# Patient Record
Sex: Female | Born: 1960 | Race: White | Hispanic: No | Marital: Married | Smoking: Former smoker
Health system: Southern US, Community
[De-identification: ages and names within clinical notes are randomized; demographics above are authoritative.]

## PROBLEM LIST (undated history)

## (undated) DIAGNOSIS — E079 Disorder of thyroid, unspecified: Secondary | ICD-10-CM

## (undated) DIAGNOSIS — E119 Type 2 diabetes mellitus without complications: Secondary | ICD-10-CM

## (undated) DIAGNOSIS — I10 Essential (primary) hypertension: Secondary | ICD-10-CM

## (undated) HISTORY — PX: ABDOMINAL HYSTERECTOMY: SHX81

## (undated) HISTORY — PX: TOTAL ABDOMINAL HYSTERECTOMY: SHX209

---

## 2009-05-27 ENCOUNTER — Ambulatory Visit: Payer: Self-pay | Admitting: Internal Medicine

## 2011-09-22 ENCOUNTER — Ambulatory Visit: Payer: Self-pay | Admitting: Internal Medicine

## 2011-09-22 LAB — COMPREHENSIVE METABOLIC PANEL
Albumin: 3.8 g/dL (ref 3.4–5.0)
Albumin: 3.9 g/dL (ref 3.4–5.0)
Anion Gap: 8 (ref 7–16)
BUN: 10 mg/dL (ref 7–18)
Bilirubin,Total: 0.2 mg/dL (ref 0.2–1.0)
Bilirubin,Total: 0.3 mg/dL (ref 0.2–1.0)
Calcium, Total: 8.8 mg/dL (ref 8.5–10.1)
Chloride: 102 mmol/L (ref 98–107)
Chloride: 103 mmol/L (ref 98–107)
Co2: 28 mmol/L (ref 21–32)
Co2: 26 mmol/L (ref 21–32)
Creatinine: 0.94 mg/dL (ref 0.60–1.30)
Creatinine: 0.81 mg/dL (ref 0.60–1.30)
EGFR (African American): >60
EGFR (African American): >60
EGFR (Non-African Amer.): >60
EGFR (Non-African Amer.): >60
Glucose: 122 mg/dL — ABNORMAL HIGH (ref 65–99)
Glucose: 93 mg/dL (ref 65–99)
Osmolality: 276 (ref 275–301)
Osmolality: 280 (ref 275–301)
Potassium: 3.7 mmol/L (ref 3.5–5.1)
SGOT(AST): 17 U/L (ref 15–37)
SGOT(AST): 20 U/L (ref 15–37)
SGPT (ALT): 26 U/L
SGPT (ALT): 26 U/L
Sodium: 138 mmol/L (ref 136–145)
Sodium: 141 mmol/L (ref 136–145)
Total Protein: 7.3 g/dL (ref 6.4–8.2)

## 2011-09-22 LAB — CBC WITH DIFFERENTIAL/PLATELET
Eosinophil %: 1.9 %
HCT: 30.9 % — ABNORMAL LOW (ref 35.0–47.0)
HGB: 10 g/dL — ABNORMAL LOW (ref 12.0–16.0)
MCH: 26.4 pg (ref 26.0–34.0)
Monocyte #: 0.8 10*3/uL — ABNORMAL HIGH (ref 0.0–0.7)
Neutrophil #: 5 10*3/uL (ref 1.4–6.5)
Neutrophil %: 61.9 %
Platelet: 194 10*3/uL (ref 150–440)
RBC: 3.79 10*6/uL — ABNORMAL LOW (ref 3.80–5.20)
WBC: 8.1 10*3/uL (ref 3.6–11.0)

## 2011-09-22 LAB — LIPASE, BLOOD: Lipase: 123 U/L (ref 73–393)

## 2011-09-22 LAB — TROPONIN I: Troponin-I: <0.02 ng/mL

## 2011-09-22 LAB — URINALYSIS, COMPLETE
Bilirubin,UR: NEGATIVE
Blood: NEGATIVE
Leukocyte Esterase: NEGATIVE
Nitrite: NEGATIVE
Protein: NEGATIVE
RBC,UR: 2 /HPF (ref 0–5)
WBC UR: 1 /HPF (ref 0–5)

## 2011-09-22 LAB — CBC
HCT: 31.6 % — ABNORMAL LOW (ref 35.0–47.0)
RBC: 3.93 10*6/uL (ref 3.80–5.20)
WBC: 8 10*3/uL (ref 3.6–11.0)

## 2011-09-22 LAB — CK TOTAL AND CKMB (NOT AT ARMC)
CK, Total: 98 U/L (ref 21–215)
CK-MB: 0.9 ng/mL (ref 0.5–3.6)

## 2011-09-23 ENCOUNTER — Observation Stay: Payer: Self-pay | Admitting: Internal Medicine

## 2011-09-23 LAB — BASIC METABOLIC PANEL
Anion Gap: 9 (ref 7–16)
Calcium, Total: 8.9 mg/dL (ref 8.5–10.1)
Chloride: 104 mmol/L (ref 98–107)
Co2: 30 mmol/L (ref 21–32)
EGFR (Non-African Amer.): >60
Glucose: 151 mg/dL — ABNORMAL HIGH (ref 65–99)
Potassium: 4.6 mmol/L (ref 3.5–5.1)
Sodium: 143 mmol/L (ref 136–145)

## 2011-09-23 LAB — LIPID PANEL
Cholesterol: 164 mg/dL (ref 0–200)
HDL Cholesterol: 24 mg/dL — ABNORMAL LOW (ref 40–60)
Ldl Cholesterol, Calc: 83 mg/dL (ref 0–100)

## 2011-09-23 LAB — CBC WITH DIFFERENTIAL/PLATELET
Basophil #: 0 10*3/uL (ref 0.0–0.1)
Basophil %: 0.4 %
Eosinophil #: 0.3 10*3/uL (ref 0.0–0.7)
Eosinophil %: 2.7 %
HCT: 31.1 % — ABNORMAL LOW (ref 35.0–47.0)
Lymphocyte #: 2.1 10*3/uL (ref 1.0–3.6)
Lymphocyte %: 23 %
MCHC: 32.4 g/dL (ref 32.0–36.0)
MCV: 81 fL (ref 80–100)
Monocyte #: 0.8 10*3/uL — ABNORMAL HIGH (ref 0.0–0.7)
Neutrophil #: 5.9 10*3/uL (ref 1.4–6.5)
Neutrophil %: 64.7 %
Platelet: 197 10*3/uL (ref 150–440)
RDW: 14.4 % (ref 11.5–14.5)

## 2011-09-23 LAB — HEMOGLOBIN A1C: Hemoglobin A1C: 7.5 % — ABNORMAL HIGH (ref 4.2–6.3)

## 2011-09-23 LAB — TSH: Thyroid Stimulating Horm: 11 u[IU]/mL — ABNORMAL HIGH

## 2011-09-23 LAB — CK TOTAL AND CKMB (NOT AT ARMC)
CK-MB: 0.7 ng/mL (ref 0.5–3.6)
CK-MB: 0.7 ng/mL (ref 0.5–3.6)

## 2011-09-23 LAB — TROPONIN I: Troponin-I: <0.02 ng/mL

## 2011-09-25 DIAGNOSIS — I10 Essential (primary) hypertension: Secondary | ICD-10-CM | POA: Insufficient documentation

## 2011-09-25 DIAGNOSIS — E039 Hypothyroidism, unspecified: Secondary | ICD-10-CM | POA: Insufficient documentation

## 2011-09-25 DIAGNOSIS — E785 Hyperlipidemia, unspecified: Secondary | ICD-10-CM | POA: Insufficient documentation

## 2011-09-25 DIAGNOSIS — E119 Type 2 diabetes mellitus without complications: Secondary | ICD-10-CM | POA: Insufficient documentation

## 2013-09-06 ENCOUNTER — Ambulatory Visit: Payer: Self-pay | Admitting: Family Medicine

## 2013-10-02 ENCOUNTER — Emergency Department: Payer: Self-pay | Admitting: Emergency Medicine

## 2013-10-02 LAB — URINALYSIS, COMPLETE
Bilirubin,UR: NEGATIVE
GLUCOSE, UR: NEGATIVE mg/dL (ref 0–75)
KETONE: NEGATIVE
LEUKOCYTE ESTERASE: NEGATIVE
Nitrite: NEGATIVE
PH: 6 (ref 4.5–8.0)
Protein: 30
RBC,UR: <1 /HPF (ref 0–5)
SPECIFIC GRAVITY: 1.005 (ref 1.003–1.030)
Squamous Epithelial: 1

## 2013-10-02 LAB — COMPREHENSIVE METABOLIC PANEL
ALBUMIN: 4.1 g/dL (ref 3.4–5.0)
ANION GAP: 5 — AB (ref 7–16)
Alkaline Phosphatase: 62 U/L
BUN: 9 mg/dL (ref 7–18)
Bilirubin,Total: 0.5 mg/dL (ref 0.2–1.0)
CALCIUM: 9.2 mg/dL (ref 8.5–10.1)
Chloride: 105 mmol/L (ref 98–107)
Co2: 25 mmol/L (ref 21–32)
Creatinine: 0.76 mg/dL (ref 0.60–1.30)
EGFR (African American): >60
GLUCOSE: 107 mg/dL — AB (ref 65–99)
OSMOLALITY: 269 (ref 275–301)
Potassium: 3.5 mmol/L (ref 3.5–5.1)
SGOT(AST): 23 U/L (ref 15–37)
SGPT (ALT): 22 U/L (ref 12–78)
SODIUM: 135 mmol/L — AB (ref 136–145)
Total Protein: 7.8 g/dL (ref 6.4–8.2)

## 2013-10-02 LAB — CBC
HCT: 38.4 % (ref 35.0–47.0)
HGB: 12.9 g/dL (ref 12.0–16.0)
MCH: 29.8 pg (ref 26.0–34.0)
MCHC: 33.7 g/dL (ref 32.0–36.0)
MCV: 89 fL (ref 80–100)
PLATELETS: 196 10*3/uL (ref 150–440)
RBC: 4.33 10*6/uL (ref 3.80–5.20)
RDW: 13.1 % (ref 11.5–14.5)
WBC: 9.1 10*3/uL (ref 3.6–11.0)

## 2013-10-02 LAB — TROPONIN I

## 2013-10-02 LAB — TSH: Thyroid Stimulating Horm: 4.67 u[IU]/mL — ABNORMAL HIGH

## 2013-10-02 LAB — T4, FREE: Free Thyroxine: 0.83 ng/dL (ref 0.76–1.46)

## 2013-10-03 DIAGNOSIS — F419 Anxiety disorder, unspecified: Secondary | ICD-10-CM | POA: Insufficient documentation

## 2014-12-13 NOTE — Discharge Summary (Signed)
PATIENT NAME:  Kara Giles, Kara Giles MR#:  [id number] DATE OF BIRTH:  Oct 21, 1960  DATE OF ADMISSION:  09/23/2011 DATE OF DISCHARGE:  09/23/2011  DISCHARGE DIAGNOSES:  1. Chest pain, noncardiac with a stress test being negative.  2. Hypertension.  3. Diabetes.  4. Hyperlipidemia.  5. Hypothyroidism.  6. Tobacco abuse.   MEDICATIONS ON DISCHARGE:  1. Atenolol 50 mg daily.  2. Hydrochlorothiazide 50 mg daily.  3. Aspirin 81 mg daily.  4. Metformin 500 mg twice a day. 5. Celexa 20 mg daily.  6. Pravastatin 20 mg at bedtime.  7. Dexilant 60 mg daily.  8. Klor-Con 10 mEq daily.  9. Levothyroxine increased to 50 mcg daily.  10. Nicotine patch 21 mg to chest wall daily.   DISCHARGE INSTRUCTIONS: Do not smoke cigarettes.   ACTIVITY: As tolerated.   DIET: Low sodium diet 1800 ADA diet.   REASON FOR ADMISSION: The patient was admitted 09/22/2011 and discharged 09/23/2011. She came in with chest pain. She was at work at the time. She also had pain in the middle of her back worsening over a period of an hour. She took aspirin at work, went to the [facility] and was sent to the Emergency Room for further evaluation. She was admitted as an observation and a stress test was ordered.  LABORATORY AND RADIOLOGICAL DATA DURING THE HOSPITAL COURSE: Glucose 122, BUN 10, creatinine 0.94, sodium 138, potassium 3.7, chloride 102, CO2 28, calcium 8.8. Liver function tests normal. White blood cell count 8.1, hemoglobin and hematocrit 10.0 and 30.9, platelet count 194. Chest x-ray showed no acute cardiopulmonary disease. TSH 11. Hemoglobin A1c 7.5. Troponin negative. Lipase 123. Next two sets of troponins were negative. Ultrasound of the abdomen showed no cholelithiasis or sonographic evidence of acute choledocholithiasis. LDL 83, HDL 24, triglycerides 283. Stress test reported by Dr. Lady GaryFath verbally was negative; still no report in computer at the time of discharge.   HOSPITAL COURSE PER PROBLEM LIST:   1. For the patient's chest pain, this had resolved on its own. She had a stress test which was negative. Three sets of cardiac enzymes were negative. The patient was sent home in stable condition. This chest pain was not cardiac, possibly musculoskeletal.  2. For her hypertension, her blood pressure was elevated on presentation but has calmed down nicely and blood pressure 118/69 upon discharge. She will be kept on her usual medications at home.  3. For her diabetes, she is on metformin. Hemoglobin A1c is 7.5. Would like a little better blood glucose control but will leave this up to the primary care physician.  4. For the patient's hyperlipidemia, she is on pravastatin. HDL is low. Exercise should help. LDL is at goal. Triglycerides are also slightly elevated.  5. For the patient's hypothyroidism, her Synthroid was increased to 50 mcg daily for a TSH being 11.  6. For her tobacco abuse, smoking cessation counseling done three minutes by me. Nicotine patch prescribed.   TIME SPENT ON DISCHARGE: 35 minutes.   ____________________________ Herschell Dimesichard J. Renae GlossWieting, MD [id number] D: 09/24/2011 15:06:22 ET T: 09/25/2011 14:10:04 ET JOB#: [id number]  cc: Herschell Dimesichard J. Renae GlossWieting, MD, <Dictator> Dr. Royanne Footsaniel Crummett  Salley ScarletICHARD J Kimiyo Carmicheal MD ELECTRONICALLY SIGNED 10/07/2011 16:38

## 2014-12-13 NOTE — H&P (Signed)
PATIENT NAME:  Kara Giles, PHO MR#:  [id number] DATE OF BIRTH:  11-Jun-1961  DATE OF ADMISSION:  09/23/2011  PRIMARY CARE PHYSICIAN: Dr. Maudie Mercury from [facility].  The patient is a 54 year old Caucasian female with past medical history significant for history of hypertension, diabetes, hyperlipidemia, also early coronary artery disease in the family, as well as smoking, presented to the hospital with complaints of chest pains. According to the patient she was doing well up until on the day of admission. 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 Pain was described as severe pain, worsening over a period of one hour between her shoulder blades as well as in the middle of her chest, 8 to 10 by intensity, did not worsen with any exertion, however, somewhat improved after she sat down. It lasted approximately four hours. No radiation was noted. She took 650 mg of aspirin at work and was sent to [facility]. From [facility] she was sent to the Emergency Room for further evaluation.   In [facility], her blood pressure was noted to be 170/70; her heart rate as well as her respiratory rate, and oxygen saturations were within normal limits.  On arrival to the Emergency Room; however, her blood pressure is within normal limits, and she does not have any significant pains in her chest, however, admits of having some soreness in the anterior chest. She has never had such kind of pains in the past.  PAST MEDICAL HISTORY: Significant for hypertension, diabetes mellitus for the past two years, history of hyperlipidemia. Also family history of coronary artery disease, patient's mother had a heart attack at early age and died massive myocardial infarction at the age of 1. She has also personal smoking history and hypothyroidism.  MEDICATIONS: Pravastatin 10 mg p.o. daily. Aspirin 81  mg p.o. daily. Atenolol 50 mg p.o. daily. Citalopram 10 mg p.o. daily. Dexilant 60 mg p.o. daily. Hydrochlorothiazide 50 mg p.o. daily. K-Dur 10 mg p.o. daily. Synthroid 50 mcg p.o. daily. Metformin 500 mg p.o. twice daily.   PAST SURGICAL HISTORY: Left ovary surgery as well as tube was removed at age of 70, right foot surgery as outpatient.   ALLERGIES: None.  FAMILY HISTORY: Diabetes mellitus in the patient's father who died of kidney failure. The patient's mother also was diabetic, she had coronary artery disease and died at the age of 18 of myocardial infarction. The patient's sister is also diabetic. Hypertension in multiple family members.   SOCIAL HISTORY: The patient is married, has one child who is 72 years old, smokes 1 pack per day for more than 30 years. Denies any alcohol abuse. She works in Adult nurse.   REVIEW OF SYSTEMS: Positive for fatigue and weakness for the past 3 or 4 weeks. The patient's husband tells me that she comes back from work and she is very tired, and she goes to bed earlier than usual. She is having chest pains today. Also using glasses, which are transitional glasses. Coughing and wheezing intermittently, especially at nighttime, and some nasal drip as well as soreness in nose which she attributes to oil from the plant from work. Has shortness of breath intermittently, especially on exertion. She had an episode of arrhythmia last year which was felt to be due to hyperthyroidism, or high doses of steroid medications and chest pains as well as back pains radiating  to the back earlier today. Also intermittent diarrhea and constipation over the past couple of weeks. Denies any fevers or chills, weight loss or gain. EYES: In regards to eyes, denies any blurry vision, double vision, glaucoma, or cataracts. ENT: Denies any tinnitus, allergies, epistaxis, sinus pain, dentures, and difficulty swallowing. RESPIRATORY: Denies any hemoptysis, asthma, or  chronic obstructive pulmonary disease. CARDIOVASCULAR: Denies any orthopnea, edema, palpitations, or syncope. GASTROINTESTINAL: Denies any nausea, hematemesis or rectal bleeding, change in bowel habits. GENITOURINARY: Denies dysuria, hematuria, frequency, or incontinence. ENDOCRINOLOGY: Denies polydipsia, nocturia, thyroid problems, heat or cold intolerance, or thirst. HEMATOLOGIC: Denies anemia, easy bruising, bleeding, or swollen glands. SKIN: Denies any acne, rash, lesions, or change in moles. MUSCULOSKELETAL: Denies arthritis, cramps, swelling, or gout. NEUROLOGIC: No numbness, epilepsy, or tremors. PSYCHIATRIC: No anxiety, insomnia, or depression.   PHYSICAL EXAMINATION: VITAL SIGNS: On arrival to the hospital temperature is 98.1, pulse 74, respiratory rate 18, blood pressure 138/63, saturation 94% on room air.   GENERAL: This is a well developed, well nourished obese Caucasian female in no significant distress sitting on the stretcher and eating a milkshake.  HEENT: Pupils are equal and reactive to light. Extraocular muscles are intact. No icterus or conjunctivitis. She has normal hearing. No oropharyngeal erythema. Mucosa is moist.   NECK: Nontender, no masses, supple. Thyroid is not enlarged. No adenopathy. No JVD or carotid bruits bilaterally. Full range of motion.   LUNGS: Mildly diminished breath sounds but there is no significant wheezing. No labored inspirations, increased effort, dullness to percussion, not in overt respiratory distress. No rales or rhonchi. Clear to auscultation.  CARDIOVASCULAR: S1 and S2 appreciated. No murmurs, gallops, or rubs noted. Chest is nontender to palpation. . No lower extremity edema, calf tenderness, or cyanosis noted.   ABDOMEN: Soft and nontender. Bowel sounds are present. No hepatomegaly or masses were noted.   RECTAL: Deferred.   MUSCULOSKELETAL: Able to move extremities. No cyanosis, degenerative joint disease, or  kyphosis. Gait is not tested.    SKIN: Did not reveal any rashes, lesions, erythema, nodularity, or induration. It was warm and dry to palpation.   LYMPH: No adenopathy in the cervical region to percussion. Palpation over patient's thoracic spine was unremarkable.  NEUROLOGICAL: Cranial nerves grossly intact. Sensory is intact. No dysarthria or aphasia. The patient is alert and oriented to time, person, and place. Cooperative. Memory is good.   PSYCHIATRIC: No significant confusion, agitation, or depression noted.  LABORATORY STUDIES: BMP within normal limits. Liver enzymes were normal. Cardiac enzymes were negative. CBC within normal limits except hemoglobin level is low at 10.4. Urinalysis: Yellow, hazy urine, negative glucose, bilirubin or ketones, specific gravity 1.018, pH of 6.0, negative for blood protein, nitrites or leukocyte esterase, two red blood cells, one white blood cell, trace bacteria, three epithelial cells and mucous is present.  RADIOLOGIC STUDIES: Chest PA and lateral showed no acute disease of the chest. Ultrasound of abdomen, limited survey September 22, 2011, revealed no cholelithiasis. There is no active evidence of acute cholecystitis. EKG: Normal sinus rhythm at 69 beats per minute, normal axis, no acute ST-T changes.   ASSESSMENT AND PLAN: 1. Chest pain. Admit the patient to medical floor. Continue her on metoprolol, aspirin as well as nitroglycerin, and heparin subcutaneous. Will get Myoview stress test in the morning.  2. Hypertension. Will change atenolol to metoprolol for now. We will continue her on metoprolol as well as nitroglycerin and hydrochlorothiazide. The patient may need to have her medications advanced because of her significantly  elevated blood pressure on arrival, we'll follow.  3. Hyperlipidemia. Continue Pravachol. Get lipid panel in the morning. 4. Diabetes mellitus. Continue metformin as well as sliding scale insulin. Check hemoglobin A1c. 5. Tobacco abuse. Discussed with the  patient for five minutes. She is agreeable to initiate nicotine replacement therapy and is willing to try to quit.   TIME SPENT: 50 minutes     ____________________________ Katharina Caperima Shanece Cochrane, MD rv:kma D: 09/22/2011 22:37:20 ET T: 09/23/2011 09:47:53 ET JOB#: [id number]  cc: Katharina Caperima Yvonna Brun, MD, <Dictator> Dr. Maudie Mercuryrummett from [facility] Jeris Roser MD ELECTRONICALLY SIGNED 10/16/2011 17:06

## 2019-07-02 ENCOUNTER — Other Ambulatory Visit: Payer: Self-pay

## 2019-07-02 DIAGNOSIS — Z20822 Contact with and (suspected) exposure to covid-19: Secondary | ICD-10-CM

## 2019-07-04 LAB — NOVEL CORONAVIRUS, NAA: SARS-CoV-2, NAA: NOT DETECTED

## 2019-10-21 DIAGNOSIS — G4733 Obstructive sleep apnea (adult) (pediatric): Secondary | ICD-10-CM | POA: Insufficient documentation

## 2019-10-21 DIAGNOSIS — G2581 Restless legs syndrome: Secondary | ICD-10-CM | POA: Insufficient documentation

## 2019-10-22 DIAGNOSIS — D649 Anemia, unspecified: Secondary | ICD-10-CM | POA: Insufficient documentation

## 2020-11-29 LAB — HM DEXA SCAN

## 2020-11-29 LAB — HM MAMMOGRAPHY

## 2021-05-20 ENCOUNTER — Other Ambulatory Visit: Payer: Self-pay

## 2021-05-20 ENCOUNTER — Ambulatory Visit
Admission: EM | Admit: 2021-05-20 | Discharge: 2021-05-20 | Disposition: A | Discharge status: Home / Self Care | Payer: 59

## 2021-05-20 ENCOUNTER — Ambulatory Visit (INDEPENDENT_AMBULATORY_CARE_PROVIDER_SITE_OTHER): Payer: 59

## 2021-05-20 ENCOUNTER — Encounter: Payer: Self-pay | Admitting: Emergency Medicine

## 2021-05-20 DIAGNOSIS — W19XXXA Unspecified fall, initial encounter: Secondary | ICD-10-CM | POA: Diagnosis not present

## 2021-05-20 DIAGNOSIS — S20212A Contusion of left front wall of thorax, initial encounter: Secondary | ICD-10-CM

## 2021-05-20 DIAGNOSIS — R0782 Intercostal pain: Secondary | ICD-10-CM

## 2021-05-20 HISTORY — DX: Type 2 diabetes mellitus without complications: E11.9

## 2021-05-20 HISTORY — DX: Essential (primary) hypertension: I10

## 2021-05-20 HISTORY — DX: Disorder of thyroid, unspecified: E07.9

## 2021-05-20 MED ORDER — TRAMADOL HCL 50 MG PO TABS: 50.0000 mg | ORAL_TABLET | Freq: Four times a day (QID) | ORAL | 0 refills | Status: AC | PRN

## 2021-05-20 NOTE — Discharge Instructions (Signed)
-  X-rays not show any fractures. -As we discussed you can apply ice to this area and take ibuprofen and Tylenol as needed for pain.  Also consider use of lidocaine patches and muscle rubs. -At the least you have bruised your ribs. -You should be seen again if the pain worsens you develop increased pain on breathing, shortness of breath, cough, fever, etc.

## 2021-05-20 NOTE — ED Provider Notes (Signed)
[facility]    CSN: [id number] Arrival date & time: 05/20/21  1602      History   Chief Complaint Chief Complaint  Patient presents with   Fall   Back Pain    HPI Kara Giles is a 60 y.o. female presenting for left lower ribs and left lower back pain following an accidental slip and fall at home about an hour prior to arrival to [facility] urgent care.  Patient says she was wearing flip-flops and was going out to get the mail when she slipped on water.  Patient has broken ribs on the left side before and says it feels similar.  She admits to sharp pains intermittently.  Has increased pain when twisting and occasionally when taking deep breaths but denies any shortness of breath.  Patient has taken 800 mg of ibuprofen prior to arrival to [facility] urgent care.  She denies any other injuries.  She denies head injury or loss of consciousness.  No other complaints.  HPI  Past Medical History:  Diagnosis Date   Diabetes mellitus without complication (HCC)    Hypertension    Thyroid disease     There are no problems to display for this patient.   Past Surgical History:  Procedure Laterality Date   ABDOMINAL HYSTERECTOMY      OB History   No obstetric history on file.      Home Medications    Prior to Admission medications   Medication Sig Start Date End Date Taking? Authorizing Provider  amLODipine (NORVASC) 2.5 MG tablet  03/04/21  Yes [provider]  atenolol (TENORMIN) 50 MG tablet Take 50 mg by mouth 2 (two) times daily. 04/01/21  Yes [provider]  doxepin (SINEQUAN) 10 MG capsule SMARTSIG:1 Capsule(s) By Mouth Every Evening 04/30/21  Yes [provider]  DULoxetine (CYMBALTA) 20 MG capsule Take by mouth. 05/02/21  Yes [provider]  esomeprazole (NEXIUM) 40 MG capsule Take 1 tablet by mouth daily. 01/13/20  Yes [provider]  hydrochlorothiazide (HYDRODIURIL) 12.5 MG tablet Take 1 tablet by mouth daily. 10/19/20   Yes [provider]  latanoprost (XALATAN) 0.005 % ophthalmic solution 1 drop at bedtime. 04/18/21  Yes [provider]  levothyroxine (SYNTHROID) 75 MCG tablet Take 1 tablet by mouth daily. 05/02/21  Yes [provider]  losartan (COZAAR) 100 MG tablet  03/04/21  Yes [provider]  metFORMIN (GLUCOPHAGE) 1000 MG tablet Take 1,000 mg by mouth 2 (two) times daily. 04/28/21  Yes [provider]  rosuvastatin (CRESTOR) 40 MG tablet Take 40 mg by mouth daily. 04/28/21  Yes [provider]  Semaglutide, 2 MG/DOSE, (OZEMPIC, 2 MG/DOSE,) 8 MG/3ML SOPN Inject into the skin. 02/18/21  Yes [provider]  traMADol (ULTRAM) 50 MG tablet Take 1 tablet (50 mg total) by mouth every 6 (six) hours as needed for up to 3 days. 05/20/21 05/23/21 Yes Shirlee Latch, PA-C    Family History History reviewed. No pertinent family history.  Social History Social History   Tobacco Use   Smoking status: Some Days    Types: Cigarettes   Smokeless tobacco: Never  Vaping Use   Vaping Use: Never used  Substance Use Topics   Alcohol use: Not Currently   Drug use: Never     Allergies   Lisinopril   Review of Systems Review of Systems  Constitutional:  Negative for fatigue.  Respiratory:  Negative for cough and shortness of breath.   Cardiovascular:  Negative for chest pain.  Gastrointestinal:  Negative for abdominal pain.  Musculoskeletal:  Positive for arthralgias (left rib pain) and back pain. Negative for gait problem.  Skin:  Negative for color change and wound.  Neurological:  Negative for dizziness, syncope, weakness, numbness and headaches.    Physical Exam Triage Vital Signs ED Triage Vitals  Enc Vitals Group     BP 05/20/21 1631 122/71     Pulse Rate 05/20/21 1631 98     Resp 05/20/21 1631 14     Temp 05/20/21 1631 98.4 F (36.9 C)     Temp Source 05/20/21 1631 Oral     SpO2 05/20/21 1631 97 %     Weight 05/20/21 1625 193 lb (87.5  kg)     Height 05/20/21 1625 5\' 7"  (1.702 m)     Head Circumference --      Peak Flow --      Pain Score 05/20/21 1625 9     Pain Loc --      Pain Edu? --      Excl. in GC? --    No data found.  Updated Vital Signs BP 122/71 (BP Location: Left Arm)   Pulse 98   Temp 98.4 F (36.9 C) (Oral)   Resp 14   Ht 5\' 7"  (1.702 m)   Wt 193 lb (87.5 kg)   SpO2 97%   BMI 30.23 kg/m      Physical Exam Vitals and nursing note reviewed.  Constitutional:      General: She is not in acute distress.    Appearance: Normal appearance. She is not ill-appearing or toxic-appearing.  HENT:     Head: Normocephalic and atraumatic.  Eyes:     General: No scleral icterus.       Right eye: No discharge.        Left eye: No discharge.     Conjunctiva/sclera: Conjunctivae normal.  Cardiovascular:     Rate and Rhythm: Normal rate and regular rhythm.     Heart sounds: Normal heart sounds.  Pulmonary:     Effort: Pulmonary effort is normal. No respiratory distress.     Breath sounds: Normal breath sounds.  Musculoskeletal:     Cervical back: Neck supple.     Comments: There is tenderness palpation along the lower left posterior/lateral ribs.  No step-offs noted.  No spinal tenderness.  Mild tenderness to palpation of the upper lumbar paravertebral muscles.  Skin:    General: Skin is dry.  Neurological:     General: No focal deficit present.     Mental Status: She is alert. Mental status is at baseline.     Motor: No weakness.     Gait: Gait normal.  Psychiatric:        Mood and Affect: Mood normal.        Behavior: Behavior normal.        Thought Content: Thought content normal.     UC Treatments / Results  Labs (all labs ordered are listed, but only abnormal results are displayed) Labs Reviewed - No data to display  EKG   Radiology DG Ribs Unilateral W/Chest Left  Result Date: 05/20/2021 CLINICAL DATA:  Fall today onto left side.  Left rib and chest pain. EXAM: LEFT RIBS AND CHEST  - 3+ VIEW COMPARISON:  Chest radiograph on 10/02/2013 FINDINGS: No acute fracture or other bone lesions are seen involving the ribs. Incidental note is made of an old fracture deformity of the right posterior  9th rib. There is no evidence of pneumothorax or pleural effusion. Both lungs are clear. Azygous fissure incidentally noted. Heart size and mediastinal contours are within normal limits. IMPRESSION: No acute findings. Electronically Signed   By: Danae Orleans M.D.   On: 05/20/2021 17:34    Procedures Procedures (including critical care time)  Medications Ordered in UC Medications - No data to display  Initial Impression / Assessment and Plan / UC Course  I have reviewed the triage vital signs and the nursing notes.  Pertinent labs & imaging results that were available during my care of the patient were reviewed by me and considered in my medical decision making (see chart for details).  60 year old female presenting for left-sided lower rib pain and low back pain.  Patient had accidental fall about an hour prior to arrival to urgent care.  X-rays obtained of left ribs and chest.  X-rays do not show any acute fractures.  Does show that she has an old fracture deformity of the right ninth rib.  Reviewed results of x-ray with patient.  Advised her that her symptoms are consistent with a rib contusion.  I advised supportive care as well as following RICE guidelines.  I have sent in a short supply of tramadol after reviewing the controlled substance database and finding patient to be low risk for abuse.  Also advised other medications including ibuprofen and Tylenol as well as lidocaine patches.  Return and ED precautions reviewed.  Final Clinical Impressions(s) / UC Diagnoses   Final diagnoses:  Contusion of rib on left side, initial encounter  Fall, initial encounter     Discharge Instructions      -X-rays not show any fractures. -As we discussed you can apply ice to this area and take  ibuprofen and Tylenol as needed for pain.  Also consider use of lidocaine patches and muscle rubs. -At the least you have bruised your ribs. -You should be seen again if the pain worsens you develop increased pain on breathing, shortness of breath, cough, fever, etc.     ED Prescriptions     Medication Sig Dispense Auth. Provider   traMADol (ULTRAM) 50 MG tablet Take 1 tablet (50 mg total) by mouth every 6 (six) hours as needed for up to 3 days. 10 tablet Shirlee Latch, PA-C      I have reviewed the PDMP during this encounter.   Shirlee Latch, PA-C 05/20/21 [phone]

## 2021-05-20 NOTE — ED Triage Notes (Signed)
Patient states that she fell about 1 hour ago after slipping on her outdoor steps at her house.  Patient c/o left sided lower back pain.

## 2022-01-21 ENCOUNTER — Other Ambulatory Visit: Payer: Self-pay

## 2022-01-21 ENCOUNTER — Emergency Department: Payer: 59

## 2022-01-21 ENCOUNTER — Encounter: Payer: Self-pay | Admitting: Emergency Medicine

## 2022-01-21 ENCOUNTER — Emergency Department
Admission: EM | Admit: 2022-01-21 | Discharge: 2022-01-21 | Disposition: A | Discharge status: Home / Self Care | Payer: 59 | Attending: Student in an Organized Health Care Education/Training Program | Admitting: Student in an Organized Health Care Education/Training Program

## 2022-01-21 DIAGNOSIS — R112 Nausea with vomiting, unspecified: Secondary | ICD-10-CM | POA: Diagnosis present

## 2022-01-21 DIAGNOSIS — R197 Diarrhea, unspecified: Secondary | ICD-10-CM

## 2022-01-21 DIAGNOSIS — E860 Dehydration: Secondary | ICD-10-CM | POA: Insufficient documentation

## 2022-01-21 DIAGNOSIS — I10 Essential (primary) hypertension: Secondary | ICD-10-CM | POA: Diagnosis not present

## 2022-01-21 DIAGNOSIS — R000 Tachycardia, unspecified: Secondary | ICD-10-CM | POA: Insufficient documentation

## 2022-01-21 DIAGNOSIS — E119 Type 2 diabetes mellitus without complications: Secondary | ICD-10-CM | POA: Diagnosis not present

## 2022-01-21 DIAGNOSIS — R109 Unspecified abdominal pain: Secondary | ICD-10-CM | POA: Diagnosis not present

## 2022-01-21 LAB — URINALYSIS, ROUTINE W REFLEX MICROSCOPIC
Bilirubin Urine: NEGATIVE
Glucose, UA: NEGATIVE mg/dL
Hgb urine dipstick: NEGATIVE
Ketones, ur: 5 mg/dL — AB
Leukocytes,Ua: NEGATIVE
Nitrite: NEGATIVE
Protein, ur: 30 mg/dL — AB
Specific Gravity, Urine: 1.026 (ref 1.005–1.030)
pH: 5 (ref 5.0–8.0)

## 2022-01-21 LAB — CBC WITH DIFFERENTIAL/PLATELET
Abs Immature Granulocytes: 0.03 10*3/uL (ref 0.00–0.07)
Basophils Absolute: 0 10*3/uL (ref 0.0–0.1)
Basophils Relative: 0 %
Eosinophils Absolute: 0 10*3/uL (ref 0.0–0.5)
Eosinophils Relative: 1 %
HCT: 39.1 % (ref 36.0–46.0)
Hemoglobin: 13.3 g/dL (ref 12.0–15.0)
Immature Granulocytes: 0 %
Lymphocytes Relative: 8 %
Lymphs Abs: 0.7 10*3/uL (ref 0.7–4.0)
MCH: 30.2 pg (ref 26.0–34.0)
MCHC: 34 g/dL (ref 30.0–36.0)
MCV: 88.9 fL (ref 80.0–100.0)
Monocytes Absolute: 0.9 10*3/uL (ref 0.1–1.0)
Monocytes Relative: 11 %
Neutro Abs: 6.7 10*3/uL (ref 1.7–7.7)
Neutrophils Relative %: 80 %
Platelets: 196 10*3/uL (ref 150–400)
RBC: 4.4 MIL/uL (ref 3.87–5.11)
RDW: 12.4 % (ref 11.5–15.5)
WBC: 8.4 10*3/uL (ref 4.0–10.5)
nRBC: 0 % (ref 0.0–0.2)

## 2022-01-21 LAB — BASIC METABOLIC PANEL
Anion gap: 12 (ref 5–15)
BUN: 23 mg/dL — ABNORMAL HIGH (ref 6–20)
CO2: 25 mmol/L (ref 22–32)
Calcium: 9 mg/dL (ref 8.9–10.3)
Chloride: 98 mmol/L (ref 98–111)
Creatinine, Ser: 0.85 mg/dL (ref 0.44–1.00)
GFR, Estimated: >60 mL/min (ref >60)
Glucose, Bld: 184 mg/dL — ABNORMAL HIGH (ref 70–99)
Potassium: 3.2 mmol/L — ABNORMAL LOW (ref 3.5–5.1)
Sodium: 135 mmol/L (ref 135–145)

## 2022-01-21 LAB — HEPATIC FUNCTION PANEL
ALT: 26 U/L (ref 0–44)
AST: 26 U/L (ref 15–41)
Albumin: 4.5 g/dL (ref 3.5–5.0)
Alkaline Phosphatase: 50 U/L (ref 38–126)
Bilirubin, Direct: 0.2 mg/dL (ref 0.0–0.2)
Indirect Bilirubin: 0.8 mg/dL (ref 0.3–0.9)
Total Bilirubin: 1 mg/dL (ref 0.3–1.2)
Total Protein: 8.1 g/dL (ref 6.5–8.1)

## 2022-01-21 LAB — LIPASE, BLOOD: Lipase: 26 U/L (ref 11–51)

## 2022-01-21 MED ORDER — ONDANSETRON 4 MG PO TBDP
8.0000 mg | ORAL_TABLET | Freq: Once | ORAL | Status: AC
  Administered 2022-01-21: 8 mg via ORAL

## 2022-01-21 MED ORDER — ONDANSETRON 4 MG PO TBDP: 4.0000 mg | ORAL_TABLET | Freq: Three times a day (TID) | ORAL | 0 refills | Status: AC | PRN

## 2022-01-21 MED ORDER — ONDANSETRON 4 MG PO TBDP
ORAL_TABLET | ORAL | Status: AC
  Filled 2022-01-21: qty 2

## 2022-01-21 MED ORDER — SODIUM CHLORIDE 0.9 % IV BOLUS
1000.0000 mL | Freq: Once | INTRAVENOUS | Status: AC
  Administered 2022-01-21: 1000 mL via INTRAVENOUS

## 2022-01-21 MED ORDER — IBUPROFEN 600 MG PO TABS
600.0000 mg | ORAL_TABLET | Freq: Once | ORAL | Status: AC
  Administered 2022-01-21: 600 mg via ORAL
  Filled 2022-01-21: qty 1

## 2022-01-21 NOTE — ED Notes (Signed)
Pt able to keep down graham crackers w/ peanut butter and ginger ale- IV bolus has approx 235ml left

## 2022-01-21 NOTE — ED Notes (Signed)
Dr Robinson at bedside 

## 2022-01-21 NOTE — ED Provider Notes (Signed)
[facility] Provider Note    Event Date/Time   First MD Initiated Contact with Patient 01/21/22 [phone]     (approximate)   History   Emesis and Diarrhea   HPI  Kara Giles is a 61 y.o. female with a history of diabetes and hypertension presents to the ER for several days of nausea vomiting diarrhea.  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  She does have crampy abdominal pain.  No cough or congestion no chest pain.     Physical Exam   Triage Vital Signs: ED Triage Vitals  Enc Vitals Group     BP 01/21/22 0451 128/71     Pulse Rate 01/21/22 0451 (!) 130     Resp 01/21/22 0451 20     Temp 01/21/22 0451 100.1 F (37.8 C)     Temp Source 01/21/22 0451 Oral     SpO2 01/21/22 0451 93 %     Weight 01/21/22 0452 198 lb (89.8 kg)     Height 01/21/22 0452 5\' 7"  (1.702 m)     Head Circumference --      Peak Flow --      Pain Score 01/21/22 0451 7     Pain Loc --      Pain Edu? --      Excl. in GC? --     Most recent vital signs: Vitals:   01/21/22 0947 01/21/22 1000  BP:  (!) 114/59  Pulse: 100 (!) 103  Resp:  18  Temp:    SpO2: 97% 96%     Constitutional: Alert  Eyes: Conjunctivae are normal.  Head: Atraumatic. Nose: No congestion/rhinnorhea. Mouth/Throat: Mucous membranes are moist.   Neck: Painless ROM.  Cardiovascular:   Good peripheral circulation. Respiratory: Normal respiratory effort.  No retractions.  Gastrointestinal: Soft and nontender in all four quadrants Musculoskeletal:  no deformity Neurologic:  MAE spontaneously. No gross focal neurologic deficits are appreciated.  Skin:  Skin is warm, dry and intact. No rash noted. Psychiatric: Mood and affect are normal. Speech and behavior are normal.    ED Results / Procedures / Treatments   Labs (all labs ordered are listed, but only abnormal results are displayed) Labs Reviewed  URINALYSIS, ROUTINE  W REFLEX MICROSCOPIC - Abnormal; Notable for the following components:      Result Value   Color, Urine YELLOW (*)    APPearance CLOUDY (*)    Ketones, ur 5 (*)    Protein, ur 30 (*)    Bacteria, UA RARE (*)    All other components within normal limits  BASIC METABOLIC PANEL - Abnormal; Notable for the following components:   Potassium 3.2 (*)    Glucose, Bld 184 (*)    BUN 23 (*)    All other components within normal limits  CBC WITH DIFFERENTIAL/PLATELET  HEPATIC FUNCTION PANEL  LIPASE, BLOOD     EKG     RADIOLOGY Please see ED Course for my review and interpretation.  I personally reviewed all radiographic images ordered to evaluate for the above acute complaints and reviewed radiology reports and findings.  These findings were personally discussed with the patient.  Please see medical record for radiology report.    PROCEDURES:  Critical Care performed: No  Procedures   MEDICATIONS ORDERED IN ED: Medications  ondansetron (ZOFRAN-ODT) disintegrating tablet 8 mg (8 mg Oral Given 01/21/22 0458)  sodium chloride 0.9 %  bolus 1,000 mL (0 mLs Intravenous Stopped 01/21/22 0850)  ibuprofen (ADVIL) tablet 600 mg (600 mg Oral Given 01/21/22 0731)  sodium chloride 0.9 % bolus 1,000 mL (0 mLs Intravenous Stopped 01/21/22 1005)     IMPRESSION / MDM / ASSESSMENT AND PLAN / ED COURSE  I reviewed the triage vital signs and the nursing notes.                              Differential diagnosis includes, but is not limited to, enteritis, gastritis, dehydration, electrolyte abnormality, SBO, biliary pathology, pancreatitis, diverticulitis, colitis  Patient presented to the ER for evaluation of symptoms as described above she is low-grade temperature mildly tachycardic but well perfused.  This presenting complaint could reflect a potentially life-threatening illness therefore the patient will be placed on continuous pulse oximetry and telemetry for monitoring.  Laboratory evaluation will  be sent to evaluate for the above complaints.  We will give IV fluids given concern for dehydration.  Will order x-ray to evaluate for obstructive pattern.    Clinical Course as of 01/21/22 1022  Sat Jan 21, 2022  0755 X-ray on my interpretation does not show any evidence of pneumonia or effusion will await formal radiology report. [PR]  1022 Patient reassessed.  Feeling significantly improved.  She is tolerating p.o.  At this point do believe she stable appropriate for trial of outpatient management.  We discussed return precautions.  Patient agreeable to plan. [PR]    Clinical Course User Index [PR] Willy Eddy, MD    Patient's presentation is most consistent with acute, uncomplicated illness.   FINAL CLINICAL IMPRESSION(S) / ED DIAGNOSES   Final diagnoses:  Diarrhea, unspecified type  Dehydration     Rx / DC Orders   ED Discharge Orders          Ordered    ondansetron (ZOFRAN-ODT) 4 MG disintegrating tablet  Every 8 hours PRN        01/21/22 0918             Note:  This document was prepared using Dragon voice recognition software and may include unintentional dictation errors.    Willy Eddy, MD 01/21/22 1022

## 2022-01-21 NOTE — ED Triage Notes (Addendum)
  Patient comes in with N/V/D that has been going on since Thursday night.  Patient states she has been having countless episodes of emesis and diarrhea.  Last episode around 0300.  Patient also endorses low grade fever.  No OTC medications in the last 24 hours.  Pain 7/10, cramping.    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

## 2022-01-21 NOTE — ED Notes (Signed)
Pt taken for xray

## 2022-02-15 ENCOUNTER — Ambulatory Visit: Payer: 59 | Admitting: Nurse Practitioner

## 2022-02-15 ENCOUNTER — Encounter: Payer: Self-pay | Admitting: Nurse Practitioner

## 2022-02-15 VITALS — BP 137/84 | HR 85 | Temp 98.2°F | Wt 187.6 lb

## 2022-02-15 DIAGNOSIS — E039 Hypothyroidism, unspecified: Secondary | ICD-10-CM | POA: Diagnosis not present

## 2022-02-15 DIAGNOSIS — E785 Hyperlipidemia, unspecified: Secondary | ICD-10-CM | POA: Diagnosis not present

## 2022-02-15 DIAGNOSIS — F419 Anxiety disorder, unspecified: Secondary | ICD-10-CM

## 2022-02-15 DIAGNOSIS — E049 Nontoxic goiter, unspecified: Secondary | ICD-10-CM

## 2022-02-15 DIAGNOSIS — Z1159 Encounter for screening for other viral diseases: Secondary | ICD-10-CM

## 2022-02-15 DIAGNOSIS — I10 Essential (primary) hypertension: Secondary | ICD-10-CM

## 2022-02-15 DIAGNOSIS — E119 Type 2 diabetes mellitus without complications: Secondary | ICD-10-CM | POA: Diagnosis not present

## 2022-02-15 DIAGNOSIS — G4733 Obstructive sleep apnea (adult) (pediatric): Secondary | ICD-10-CM

## 2022-02-15 DIAGNOSIS — Z114 Encounter for screening for human immunodeficiency virus [HIV]: Secondary | ICD-10-CM

## 2022-02-15 DIAGNOSIS — Z7689 Persons encountering health services in other specified circumstances: Secondary | ICD-10-CM

## 2022-02-15 DIAGNOSIS — Z1231 Encounter for screening mammogram for malignant neoplasm of breast: Secondary | ICD-10-CM

## 2022-02-15 DIAGNOSIS — Z716 Tobacco abuse counseling: Secondary | ICD-10-CM

## 2022-02-15 MED ORDER — CHANTIX STARTING MONTH PAK 0.5 MG X 11 & 1 MG X 42 PO TBPK: ORAL_TABLET | ORAL | 0 refills | Status: DC

## 2022-02-15 NOTE — Assessment & Plan Note (Signed)
Chronic.  Controlled.  Continue with current medication regimen of Atenolol, hydrochlorothiazide, and Amlodipine.  Labs ordered today.  Return to clinic in 6 months for reevaluation.  Call sooner if concerns arise.

## 2022-02-15 NOTE — Assessment & Plan Note (Signed)
Chronic.  Controlled.  Continue with current medication regimen of Duloxetine.  Labs ordered today.  Return to clinic in 6 months for reevaluation.  Call sooner if concerns arise.   

## 2022-02-15 NOTE — Assessment & Plan Note (Signed)
Chronic. On Metformin and Ozempic.  She is not sure of her Ozempic dose.  Will let us know dose when she gets home.  Does need a refill and will send it in once we know the dose.  Labs ordered to evaluate A1c.  Will make recommendations based on lab results. Follow up in 3 months for reevaluation.

## 2022-02-15 NOTE — Assessment & Plan Note (Signed)
Chronic. Not currently using her CPAP.  Encouraged her to restart use.

## 2022-02-15 NOTE — Assessment & Plan Note (Signed)
Will start chantix. Has done well in the past with this medication.  Side effects and benefits of medication during visit.  Recommend picking a day in 2 weeks and stopping smoking.  Discussed how to titrate medication.  Follow up in 1 month for reevaluation.  Call sooner if concerns arise. 

## 2022-02-15 NOTE — Assessment & Plan Note (Signed)
Chronic.  Labs ordered today. Will make recommendations based on lab results.  

## 2022-02-15 NOTE — Progress Notes (Signed)
 BP 137/84   Pulse 85   Temp 98.2 F (36.8 C) (Oral)   Wt 187 lb 9.6 oz (85.1 kg)   SpO2 95%   BMI 29.38 kg/m    Subjective:    Patient ID: Kara Giles, female    DOB: 10/28/1960, 61 y.o.   MRN: [id number]  HPI: Kara Giles is a 61 y.o. female  Chief Complaint  Patient presents with   Establish Care    Patient would like to discuss generic chantix, she would like to quit smoking cigarettes    Patient presents to clinic to establish care with new PCP.  Introduced to nurse practitioner role and practice setting.  All questions answered.  Discussed provider/patient relationship and expectations.  Patient reports a history of Hypertension, high cholesterol, diabetes, hypothyroidism, Anxiety, RLS, OSA.  Patient denies a history of: Depression, Neurological problems, and Abdominal problems.   HYPERTENSION / HYPERLIPIDEMIA Satisfied with current treatment? no Duration of hypertension: years BP monitoring frequency: rarely BP range: 120-130/80 BP medication side effects: no Past BP meds: amlodipine, atenolol, and HCTZ Duration of hyperlipidemia: years Cholesterol medication side effects: no Cholesterol supplements: none Past cholesterol medications: rosuvastatin (crestor) Medication compliance: excellent compliance Aspirin: no Recent stressors: no Recurrent headaches: no Visual changes: no Palpitations: no Dyspnea: no Chest pain: no Lower extremity edema: no Dizzy/lightheaded: no  SMOKING CESSATION Smoking Status: current everyday smoker Smoking Amount: 1-1.5 ppd Smoking Onset: 40 years Smoking Quit Date:  Smoking triggers:  Type of tobacco use: cigarette smoking Children in the house: yes Other household members who smoke: yes Treatments attempted:  Pneumovax:   Patient states she is having neck pain and can feel something when she swallows.  Does have a history of hypothyroidism.     Active Ambulatory Problems    Diagnosis Date Noted   Anemia  10/22/2019   Anxiety 10/03/2013   Controlled type 2 diabetes mellitus without complication, without long-term current use of insulin (HCC) 09/25/2011   Hyperlipidemia 09/25/2011   Hypertension 09/25/2011   Hypothyroidism, unspecified 09/25/2011   Obstructive sleep apnea 10/21/2019   RLS (restless legs syndrome) 10/21/2019   Encounter for smoking cessation counseling 02/15/2022   Resolved Ambulatory Problems    Diagnosis Date Noted   No Resolved Ambulatory Problems   Past Medical History:  Diagnosis Date   Diabetes mellitus without complication (HCC)    Thyroid disease    Past Surgical History:  Procedure Laterality Date   ABDOMINAL HYSTERECTOMY     History reviewed. No pertinent family history.   Review of Systems  HENT:  Positive for trouble swallowing.   Eyes:  Negative for visual disturbance.  Respiratory:  Positive for cough. Negative for chest tightness and shortness of breath.   Cardiovascular:  Negative for chest pain, palpitations and leg swelling.  Neurological:  Negative for dizziness and headaches.    Per HPI unless specifically indicated above     Objective:    BP 137/84   Pulse 85   Temp 98.2 F (36.8 C) (Oral)   Wt 187 lb 9.6 oz (85.1 kg)   SpO2 95%   BMI 29.38 kg/m   Wt Readings from Last 3 Encounters:  02/15/22 187 lb 9.6 oz (85.1 kg)  01/21/22 198 lb (89.8 kg)  05/20/21 193 lb (87.5 kg)    Physical Exam Vitals and nursing note reviewed.  Constitutional:      General: She is not in acute distress.    Appearance: Normal appearance. She is normal weight. She is not   ill-appearing, toxic-appearing or diaphoretic.  HENT:     Head: Normocephalic.     Right Ear: External ear normal.     Left Ear: External ear normal.     Nose: Nose normal.     Mouth/Throat:     Mouth: Mucous membranes are moist.     Pharynx: Oropharynx is clear.  Eyes:     General:        Right eye: No discharge.        Left eye: No discharge.     Extraocular Movements:  Extraocular movements intact.     Conjunctiva/sclera: Conjunctivae normal.     Pupils: Pupils are equal, round, and reactive to light.  Neck:     Thyroid: Thyromegaly present. No thyroid tenderness.  Cardiovascular:     Rate and Rhythm: Normal rate and regular rhythm.     Heart sounds: No murmur heard. Pulmonary:     Effort: Pulmonary effort is normal. No respiratory distress.     Breath sounds: Normal breath sounds. No wheezing or rales.  Musculoskeletal:     Cervical back: Normal range of motion and neck supple. Edema present. No erythema.  Skin:    General: Skin is warm and dry.     Capillary Refill: Capillary refill takes less than 2 seconds.  Neurological:     General: No focal deficit present.     Mental Status: She is alert and oriented to person, place, and time. Mental status is at baseline.  Psychiatric:        Mood and Affect: Mood normal.        Behavior: Behavior normal.        Thought Content: Thought content normal.        Judgment: Judgment normal.     Results for orders placed or performed during the hospital encounter of 01/21/22  Urinalysis, Routine w reflex microscopic Urine, Clean Catch  Result Value Ref Range   Color, Urine YELLOW (A) YELLOW   APPearance CLOUDY (A) CLEAR   Specific Gravity, Urine 1.026 1.005 - 1.030   pH 5.0 5.0 - 8.0   Glucose, UA NEGATIVE NEGATIVE mg/dL   Hgb urine dipstick NEGATIVE NEGATIVE   Bilirubin Urine NEGATIVE NEGATIVE   Ketones, ur 5 (A) NEGATIVE mg/dL   Protein, ur 30 (A) NEGATIVE mg/dL   Nitrite NEGATIVE NEGATIVE   Leukocytes,Ua NEGATIVE NEGATIVE   RBC / HPF 0-5 0 - 5 RBC/hpf   WBC, UA 0-5 0 - 5 WBC/hpf   Bacteria, UA RARE (A) NONE SEEN   Squamous Epithelial / LPF 0-5 0 - 5   Mucus PRESENT   CBC with Differential  Result Value Ref Range   WBC 8.4 4.0 - 10.5 K/uL   RBC 4.40 3.87 - 5.11 MIL/uL   Hemoglobin 13.3 12.0 - 15.0 g/dL   HCT 39.1 36.0 - 46.0 %   MCV 88.9 80.0 - 100.0 fL   MCH 30.2 26.0 - 34.0 pg   MCHC  34.0 30.0 - 36.0 g/dL   RDW 12.4 11.5 - 15.5 %   Platelets 196 150 - 400 K/uL   nRBC 0.0 0.0 - 0.2 %   Neutrophils Relative % 80 %   Neutro Abs 6.7 1.7 - 7.7 K/uL   Lymphocytes Relative 8 %   Lymphs Abs 0.7 0.7 - 4.0 K/uL   Monocytes Relative 11 %   Monocytes Absolute 0.9 0.1 - 1.0 K/uL   Eosinophils Relative 1 %   Eosinophils Absolute 0.0 0.0 - 0.5 K/uL  Basophils Relative 0 %   Basophils Absolute 0.0 0.0 - 0.1 K/uL   Immature Granulocytes 0 %   Abs Immature Granulocytes 0.03 0.00 - 0.07 K/uL  Basic metabolic panel  Result Value Ref Range   Sodium 135 135 - 145 mmol/L   Potassium 3.2 (L) 3.5 - 5.1 mmol/L   Chloride 98 98 - 111 mmol/L   CO2 25 22 - 32 mmol/L   Glucose, Bld 184 (H) 70 - 99 mg/dL   BUN 23 (H) 6 - 20 mg/dL   Creatinine, Ser 0.85 0.44 - 1.00 mg/dL   Calcium 9.0 8.9 - 10.3 mg/dL   GFR, Estimated >60 >60 mL/min   Anion gap 12 5 - 15  Hepatic function panel  Result Value Ref Range   Total Protein 8.1 6.5 - 8.1 g/dL   Albumin 4.5 3.5 - 5.0 g/dL   AST 26 15 - 41 U/L   ALT 26 0 - 44 U/L   Alkaline Phosphatase 50 38 - 126 U/L   Total Bilirubin 1.0 0.3 - 1.2 mg/dL   Bilirubin, Direct 0.2 0.0 - 0.2 mg/dL   Indirect Bilirubin 0.8 0.3 - 0.9 mg/dL  Lipase, blood  Result Value Ref Range   Lipase 26 11 - 51 U/L      Assessment & Plan:   Problem List Items Addressed This Visit       Cardiovascular and Mediastinum   Hypertension    Chronic.  Controlled.  Continue with current medication regimen of Atenolol, hydrochlorothiazide, and Amlodipine.  Labs ordered today.  Return to clinic in 6 months for reevaluation.  Call sooner if concerns arise.        Relevant Orders   Comp Met (CMET)     Respiratory   Obstructive sleep apnea    Chronic. Not currently using her CPAP.  Encouraged her to restart use.         Endocrine   Controlled type 2 diabetes mellitus without complication, without long-term current use of insulin (HCC) - Primary    Chronic. On Metformin  and Ozempic.  She is not sure of her Ozempic dose.  Will let us know dose when she gets home.  Does need a refill and will send it in once we know the dose.  Labs ordered to evaluate A1c.  Will make recommendations based on lab results. Follow up in 3 months for reevaluation.      Relevant Orders   HgB A1c   Hypothyroidism, unspecified    Chronic. Labs ordered today. Will make recommendations based on lab results.       Relevant Orders   TSH   T4, free     Other   Anxiety    Chronic.  Controlled.  Continue with current medication regimen of Duloxetine.  Labs ordered today.  Return to clinic in 6 months for reevaluation.  Call sooner if concerns arise.        Hyperlipidemia    Chronic.  Controlled.  Continue with current medication regimen of Crestor 40mg.  Labs ordered today.  Return to clinic in 6 months for reevaluation.  Call sooner if concerns arise.        Relevant Orders   Lipid Profile   Encounter for smoking cessation counseling    Will start chantix. Has done well in the past with this medication.  Side effects and benefits of medication during visit.  Recommend picking a day in 2 weeks and stopping smoking.  Discussed how to titrate medication.  Follow   up in 1 month for reevaluation.  Call sooner if concerns arise.      Other Visit Diagnoses     Encounter to establish care       Screening for HIV (human immunodeficiency virus)       Relevant Orders   HIV Antibody (routine testing w rflx)   Encounter for hepatitis C screening test for low risk patient       Relevant Orders   Hepatitis C Antibody   Encounter for screening mammogram for malignant neoplasm of breast       Relevant Orders   MM 3D SCREEN BREAST BILATERAL   Enlarged thyroid       Relevant Orders   US THYROID        Follow up plan: Return in about 1 month (around 03/17/2022) for Smoking cessation.

## 2022-02-15 NOTE — Assessment & Plan Note (Signed)
Chronic.  Controlled.  Continue with current medication regimen of Crestor 40mg.   Labs ordered today.  Return to clinic in 6 months for reevaluation.  Call sooner if concerns arise.   

## 2022-02-16 LAB — COMPREHENSIVE METABOLIC PANEL
ALT: 20 IU/L (ref 0–32)
AST: 18 IU/L (ref 0–40)
Albumin/Globulin Ratio: 2 (ref 1.2–2.2)
Albumin: 4.8 g/dL (ref 3.8–4.9)
Alkaline Phosphatase: 66 IU/L (ref 44–121)
BUN/Creatinine Ratio: 18 (ref 9–23)
BUN: 15 mg/dL (ref 6–24)
Bilirubin Total: 0.5 mg/dL (ref 0.0–1.2)
CO2: 24 mmol/L (ref 20–29)
Calcium: 9.6 mg/dL (ref 8.7–10.2)
Chloride: 101 mmol/L (ref 96–106)
Creatinine, Ser: 0.82 mg/dL (ref 0.57–1.00)
Globulin, Total: 2.4 g/dL (ref 1.5–4.5)
Glucose: 109 mg/dL — ABNORMAL HIGH (ref 70–99)
Potassium: 4 mmol/L (ref 3.5–5.2)
Sodium: 137 mmol/L (ref 134–144)
Total Protein: 7.2 g/dL (ref 6.0–8.5)
eGFR: 82 mL/min/{1.73_m2} (ref >59)

## 2022-02-16 LAB — HEMOGLOBIN A1C
Est. average glucose Bld gHb Est-mCnc: 157 mg/dL
Hgb A1c MFr Bld: 7.1 % — ABNORMAL HIGH (ref 4.8–5.6)

## 2022-02-16 LAB — HIV ANTIBODY (ROUTINE TESTING W REFLEX): HIV Screen 4th Generation wRfx: NONREACTIVE

## 2022-02-16 LAB — LIPID PANEL
Chol/HDL Ratio: 3.1 ratio (ref 0.0–4.4)
Cholesterol, Total: 107 mg/dL (ref 100–199)
HDL: 35 mg/dL — ABNORMAL LOW (ref >39)
LDL Chol Calc (NIH): 51 mg/dL (ref 0–99)
Triglycerides: 114 mg/dL (ref 0–149)
VLDL Cholesterol Cal: 21 mg/dL (ref 5–40)

## 2022-02-16 LAB — TSH: TSH: 1.92 u[IU]/mL (ref 0.450–4.500)

## 2022-02-16 LAB — HEPATITIS C ANTIBODY: Hep C Virus Ab: NONREACTIVE

## 2022-02-16 LAB — T4, FREE: Free T4: 1.49 ng/dL (ref 0.82–1.77)

## 2022-02-16 MED ORDER — OZEMPIC (2 MG/DOSE) 8 MG/3ML ~~LOC~~ SOPN: 2.0000 mg | PEN_INJECTOR | SUBCUTANEOUS | 1 refills | Status: DC

## 2022-02-16 NOTE — Progress Notes (Signed)
Refill sent to the pharmacy 

## 2022-02-16 NOTE — Progress Notes (Signed)
Please let patient know that her lab work looks good.  A1c is well controlled at 7.1.  Continue with current medication regimen.  Please have her tell you which dose of Ozempic she is on so I can send in the refill.   Continue with her current dose of Levothyroxine.  Hepatitis C and HIV are both negative.  Please let me know if she has any questions.

## 2022-02-16 NOTE — Addendum Note (Signed)
Addended by: Larae Grooms on: 02/16/2022 11:35 AM   Modules accepted: Orders

## 2022-02-28 ENCOUNTER — Ambulatory Visit
Admission: RE | Admit: 2022-02-28 | Discharge: 2022-02-28 | Disposition: A | Discharge status: Home / Self Care | Payer: 59 | Source: Ambulatory Visit | Attending: Nurse Practitioner | Admitting: Nurse Practitioner

## 2022-02-28 DIAGNOSIS — E049 Nontoxic goiter, unspecified: Secondary | ICD-10-CM | POA: Diagnosis present

## 2022-03-02 NOTE — Progress Notes (Signed)
Please let patient know that her thyroid ultrasound shows that she has multiple nodules.  At this time they are benign in appearance.  They do not need further testing.  We will continue to monitor these in the future.

## 2022-03-15 LAB — HM DIABETES EYE EXAM

## 2022-03-16 ENCOUNTER — Other Ambulatory Visit: Payer: Self-pay | Admitting: Nurse Practitioner

## 2022-03-17 NOTE — Telephone Encounter (Signed)
Medication refill request for Chantix starter pack, Last refill. 02/15/2022. Upcoming  OV scheduled for 03/28/2022. Last seen 02/15/2022. Please advise.

## 2022-03-17 NOTE — Telephone Encounter (Signed)
Requested medications are due for refill today.  no  Requested medications are on the active medications list.  yes  Last refill. 02/15/2022  Future visit scheduled.   yes  Notes to clinic.  This request is for a refill of the starter pack.    Requested Prescriptions  Pending Prescriptions Disp Refills   varenicline (CHANTIX PAK) 0.5 MG X 11 & 1 MG X 42 tablet [Pharmacy Med Name: VARENICLINE STARTING MONTH BOX] 53 each 0    Sig: TAKE ONE 0.5 MG TABLET BY MOUTH ONCE DAILY FOR 3 DAYS, THEN INCREASE TO ONE 0.5 MG TABLET TWICE DAILY FOR 4 DAYS, THEN INCREASE TO ONE 1 MG TABLET TWICE DAILY.     Psychiatry:  Drug Dependence Therapy - varenicline Failed - 03/16/2022  6:38 PM      Failed - Manual Review: Do not refill starter pack. 1 mg tabs may be extended up to one year if the patient has quit smoking but still feels at risk for relapse.      Passed - Cr in normal range and within 180 days    Creatinine  Date Value Ref Range Status  10/02/2013 0.76 0.60 - 1.30 mg/dL Final   Creatinine, Ser  Date Value Ref Range Status  02/15/2022 0.82 0.57 - 1.00 mg/dL Final         Passed - Completed PHQ-2 or PHQ-9 in the last 360 days      Passed - Valid encounter within last 6 months    Recent Outpatient Visits           1 month ago Controlled type 2 diabetes mellitus without complication, without long-term current use of insulin (HCC)   [facility] Larae Grooms, NP       Future Appointments             In 1 week Larae Grooms, NP [facility]

## 2022-03-27 NOTE — Progress Notes (Unsigned)
   There were no vitals taken for this visit.   Subjective:    Patient ID: Kara Giles, female    DOB: 13-Aug-1961, 61 y.o.   MRN: [id number]  HPI: Kara Giles is a 61 y.o. female  No chief complaint on file.  SMOKING CESSATION Smoking Status: current everyday smoker Smoking Amount: 1-1.5 ppd Smoking Onset: 40 years Smoking Quit Date:  Smoking triggers:  Type of tobacco use: cigarette smoking Children in the house: yes Other household members who smoke: yes Treatments attempted:  Pneumovax:    Relevant past medical, surgical, family and social history reviewed and updated as indicated. Interim medical history since our last visit reviewed. Allergies and medications reviewed and updated.  Review of Systems  Per HPI unless specifically indicated above     Objective:    There were no vitals taken for this visit.  Wt Readings from Last 3 Encounters:  02/15/22 187 lb 9.6 oz (85.1 kg)  01/21/22 198 lb (89.8 kg)  05/20/21 193 lb (87.5 kg)    Physical Exam  Results for orders placed or performed in visit on 02/15/22  Comp Met (CMET)  Result Value Ref Range   Glucose 109 (H) 70 - 99 mg/dL   BUN 15 6 - 24 mg/dL   Creatinine, Ser 0.82 0.57 - 1.00 mg/dL   eGFR 82 >59 mL/min/1.73   BUN/Creatinine Ratio 18 9 - 23   Sodium 137 134 - 144 mmol/L   Potassium 4.0 3.5 - 5.2 mmol/L   Chloride 101 96 - 106 mmol/L   CO2 24 20 - 29 mmol/L   Calcium 9.6 8.7 - 10.2 mg/dL   Total Protein 7.2 6.0 - 8.5 g/dL   Albumin 4.8 3.8 - 4.9 g/dL   Globulin, Total 2.4 1.5 - 4.5 g/dL   Albumin/Globulin Ratio 2.0 1.2 - 2.2   Bilirubin Total 0.5 0.0 - 1.2 mg/dL   Alkaline Phosphatase 66 44 - 121 IU/L   AST 18 0 - 40 IU/L   ALT 20 0 - 32 IU/L  Lipid Profile  Result Value Ref Range   Cholesterol, Total 107 100 - 199 mg/dL   Triglycerides 114 0 - 149 mg/dL   HDL 35 (L) >39 mg/dL   VLDL Cholesterol Cal 21 5 - 40 mg/dL   LDL Chol Calc (NIH) 51 0 - 99 mg/dL   Chol/HDL Ratio 3.1 0.0 - 4.4  ratio  HgB A1c  Result Value Ref Range   Hgb A1c MFr Bld 7.1 (H) 4.8 - 5.6 %   Est. average glucose Bld gHb Est-mCnc 157 mg/dL  TSH  Result Value Ref Range   TSH 1.920 0.450 - 4.500 uIU/mL  T4, free  Result Value Ref Range   Free T4 1.49 0.82 - 1.77 ng/dL  Hepatitis C Antibody  Result Value Ref Range   Hep C Virus Ab Non Reactive Non Reactive  HIV Antibody (routine testing w rflx)  Result Value Ref Range   HIV Screen 4th Generation wRfx Non Reactive Non Reactive      Assessment & Plan:   Problem List Items Addressed This Visit       Other   Encounter for smoking cessation counseling - Primary     Follow up plan: No follow-ups on file.

## 2022-03-28 ENCOUNTER — Ambulatory Visit: Payer: 59 | Admitting: Nurse Practitioner

## 2022-03-28 ENCOUNTER — Encounter: Payer: Self-pay | Admitting: Nurse Practitioner

## 2022-03-28 VITALS — BP 133/84 | HR 83 | Temp 98.5°F | Wt 189.3 lb

## 2022-03-28 DIAGNOSIS — Z716 Tobacco abuse counseling: Secondary | ICD-10-CM

## 2022-03-28 NOTE — Assessment & Plan Note (Signed)
Patient stopped smoking on July 16.  Praised for smoking cessation.  Continue to withhold from smoking.  Return to clinic if urge to begin smoking returns.

## 2022-04-04 ENCOUNTER — Encounter: Payer: Self-pay | Admitting: Nurse Practitioner

## 2022-04-17 ENCOUNTER — Other Ambulatory Visit: Payer: Self-pay | Admitting: Nurse Practitioner

## 2022-04-18 NOTE — Telephone Encounter (Signed)
dc'd 03/28/22 by K. Caren Griffins NP "completed course"  Requested Prescriptions  Refused Prescriptions Disp Refills  . varenicline (CHANTIX PAK) 0.5 MG X 11 & 1 MG X 42 tablet [Pharmacy Med Name: VARENICLINE STARTING MONTH BOX] 53 each 0    Sig: TAKE ONE 0.5 MG TABLET BY MOUTH ONCE DAILY FOR 3 DAYS, THEN INCREASE TO ONE 0.5 MG TABLET TWICE DAILY FOR 4 DAYS, THEN INCREASE TO ONE 1 MG TABLET TWICE DAILY.     Psychiatry:  Drug Dependence Therapy - varenicline Failed - 04/17/2022  7:55 AM      Failed - Manual Review: Do not refill starter pack. 1 mg tabs may be extended up to one year if the patient has quit smoking but still feels at risk for relapse.      Passed - Cr in normal range and within 180 days    Creatinine  Date Value Ref Range Status  10/02/2013 0.76 0.60 - 1.30 mg/dL Final   Creatinine, Ser  Date Value Ref Range Status  02/15/2022 0.82 0.57 - 1.00 mg/dL Final         Passed - Completed PHQ-2 or PHQ-9 in the last 360 days      Passed - Valid encounter within last 6 months    Recent Outpatient Visits          3 weeks ago Encounter for smoking cessation counseling   [facility] Larae Grooms, NP   2 months ago Controlled type 2 diabetes mellitus without complication, without long-term current use of insulin (HCC)   [facility] Larae Grooms, NP      Future Appointments            In 2 months Larae Grooms, NP [facility]

## 2022-05-08 ENCOUNTER — Other Ambulatory Visit: Payer: Self-pay | Admitting: Nurse Practitioner

## 2022-05-09 NOTE — Telephone Encounter (Signed)
Not on current med list. Requested Prescriptions  Pending Prescriptions Disp Refills  . varenicline (CHANTIX PAK) 0.5 MG X 11 & 1 MG X 42 tablet [Pharmacy Med Name: VARENICLINE STARTING MONTH BOX] 53 each 0    Sig: TAKE ONE 0.5 MG TABLET BY MOUTH ONCE DAILY FOR 3 DAYS, THEN INCREASE TO ONE 0.5 MG TABLET TWICE DAILY FOR 4 DAYS, THEN INCREASE TO ONE 1 MG TABLET TWICE DAILY.     Psychiatry:  Drug Dependence Therapy - varenicline Failed - 05/08/2022  5:15 AM      Failed - Manual Review: Do not refill starter pack. 1 mg tabs may be extended up to one year if the patient has quit smoking but still feels at risk for relapse.      Passed - Cr in normal range and within 180 days    Creatinine  Date Value Ref Range Status  10/02/2013 0.76 0.60 - 1.30 mg/dL Final   Creatinine, Ser  Date Value Ref Range Status  02/15/2022 0.82 0.57 - 1.00 mg/dL Final         Passed - Completed PHQ-2 or PHQ-9 in the last 360 days      Passed - Valid encounter within last 6 months    Recent Outpatient Visits          1 month ago Encounter for smoking cessation counseling   [facility] Jon Billings, NP   2 months ago Controlled type 2 diabetes mellitus without complication, without long-term current use of insulin ([place])   [facility] Jon Billings, NP      Future Appointments            In 1 month Jon Billings, NP [facility], [place]

## 2022-06-01 ENCOUNTER — Other Ambulatory Visit: Payer: Self-pay | Admitting: Nurse Practitioner

## 2022-06-02 NOTE — Telephone Encounter (Signed)
Trulicity: Pharmacy asking for alternative med- not on formulary. Routing to provider.  Requested Prescriptions  Pending Prescriptions Disp Refills   Dulaglutide (TRULICITY) 5.79 UX/8.3FX SOPN [Pharmacy Med Name: TRULICITY 8.32 NV/9.1 ML PEN]  0     Endocrinology:  Diabetes - GLP-1 Receptor Agonists Passed - 06/01/2022  6:55 PM      Passed - HBA1C is between 0 and 7.9 and within 180 days    Hemoglobin A1C  Date Value Ref Range Status  09/22/2011 7.5 (H) 4.2 - 6.3 % Final    Comment:    The American Diabetes Association recommends that a primary goal of therapy should be <7% and that physicians should reevaluate the treatment regimen in patients with HbA1c values consistently >8%.    Hgb A1c MFr Bld  Date Value Ref Range Status  02/15/2022 7.1 (H) 4.8 - 5.6 % Final    Comment:             Prediabetes: 5.7 - 6.4          Diabetes: >6.4          Glycemic control for adults with diabetes: <7.0          Passed - Valid encounter within last 6 months    Recent Outpatient Visits           2 months ago Encounter for smoking cessation counseling   [facility] Jon Billings, NP   3 months ago Controlled type 2 diabetes mellitus without complication, without long-term current use of insulin ([place])   [facility] Jon Billings, NP       Future Appointments             In 3 weeks Jon Billings, NP [facility], [place]

## 2022-06-02 NOTE — Telephone Encounter (Signed)
Please find out what insurance patient has.

## 2022-06-05 NOTE — Telephone Encounter (Signed)
LVMTRC 

## 2022-06-07 ENCOUNTER — Telehealth: Payer: Self-pay

## 2022-06-07 NOTE — Telephone Encounter (Signed)
Left detailed VM to inform her of PA approval for trulicity.

## 2022-06-07 NOTE — Telephone Encounter (Signed)
PA has been initiated via covermymeds for Trulicity. Awaiting determination   Key: [id number]

## 2022-06-08 ENCOUNTER — Telehealth: Payer: Self-pay | Admitting: Nurse Practitioner

## 2022-06-08 NOTE — Telephone Encounter (Signed)
Then went back to smoking. Pt is wanting to know does she need to resume the starter pack or can she get a script for Chantix Please advise   [phone]  Preferred [place]

## 2022-06-09 NOTE — Telephone Encounter (Signed)
The patient has returned missed call from [facility]   The patient can be reached anytime after 3:30 PM

## 2022-06-09 NOTE — Telephone Encounter (Signed)
LVMTRC 

## 2022-06-12 NOTE — Progress Notes (Signed)
There were no vitals taken for this visit.   Subjective:    Patient ID: Kara Giles, female    DOB: 04/04/1961, 61 y.o.   MRN: [id number]  HPI: Kara Giles is a 61 y.o. female  Chief Complaint  Patient presents with   smoking cessation    Pt would like to restart chantix    SMOKING CESSATION Smoking Status: current everyday Patient stopped smoking then went on a trip with friends and started smoking again.  States she wants to start the starter pack again then stay on the maintenance longer this time.  Denies other concerns.  Has tolerated chantix well in the past.  Relevant past medical, surgical, family and social history reviewed and updated as indicated. Interim medical history since our last visit reviewed. Allergies and medications reviewed and updated.  Review of Systems  All other systems reviewed and are negative.   Per HPI unless specifically indicated above     Objective:    There were no vitals taken for this visit.  Wt Readings from Last 3 Encounters:  03/28/22 189 lb 4.8 oz (85.9 kg)  02/15/22 187 lb 9.6 oz (85.1 kg)  01/21/22 198 lb (89.8 kg)    Physical Exam Vitals and nursing note reviewed.  Constitutional:      General: She is awake. She is not in acute distress.    Appearance: She is well-developed. She is not ill-appearing.  HENT:     Head: Normocephalic and atraumatic.     Right Ear: Hearing, tympanic membrane, ear canal and external ear normal. No drainage.     Left Ear: Hearing, tympanic membrane, ear canal and external ear normal. No drainage.     Nose: Nose normal.     Right Sinus: No maxillary sinus tenderness or frontal sinus tenderness.     Left Sinus: No maxillary sinus tenderness or frontal sinus tenderness.     Mouth/Throat:     Mouth: Mucous membranes are moist.     Pharynx: Oropharynx is clear. Uvula midline. No pharyngeal swelling, oropharyngeal exudate or posterior oropharyngeal erythema.  Eyes:     General: Lids are  normal.        Right eye: No discharge.        Left eye: No discharge.     Extraocular Movements: Extraocular movements intact.     Conjunctiva/sclera: Conjunctivae normal.     Pupils: Pupils are equal, round, and reactive to light.     Visual Fields: Right eye visual fields normal and left eye visual fields normal.  Neck:     Thyroid: No thyromegaly.     Vascular: No carotid bruit.     Trachea: Trachea normal.  Cardiovascular:     Rate and Rhythm: Normal rate and regular rhythm.     Heart sounds: Normal heart sounds. No murmur heard.    No gallop.  Pulmonary:     Effort: Pulmonary effort is normal. No accessory muscle usage or respiratory distress.     Breath sounds: Normal breath sounds.  Chest:  Breasts:    Right: Normal.     Left: Normal.  Abdominal:     General: Bowel sounds are normal.     Palpations: Abdomen is soft. There is no hepatomegaly or splenomegaly.     Tenderness: There is no abdominal tenderness.  Musculoskeletal:        General: Normal range of motion.     Cervical back: Normal range of motion and neck supple.  Right lower leg: No edema.     Left lower leg: No edema.  Lymphadenopathy:     Head:     Right side of head: No submental, submandibular, tonsillar, preauricular or posterior auricular adenopathy.     Left side of head: No submental, submandibular, tonsillar, preauricular or posterior auricular adenopathy.     Cervical: No cervical adenopathy.     Upper Body:     Right upper body: No supraclavicular, axillary or pectoral adenopathy.     Left upper body: No supraclavicular, axillary or pectoral adenopathy.  Skin:    General: Skin is warm and dry.     Capillary Refill: Capillary refill takes less than 2 seconds.     Findings: No rash.  Neurological:     Mental Status: She is alert and oriented to person, place, and time.     Gait: Gait is intact.     Deep Tendon Reflexes: Reflexes are normal and symmetric.     Reflex Scores:       Brachioradialis reflexes are 2+ on the right side and 2+ on the left side.      Patellar reflexes are 2+ on the right side and 2+ on the left side. Psychiatric:        Attention and Perception: Attention normal.        Mood and Affect: Mood normal.        Speech: Speech normal.        Behavior: Behavior normal. Behavior is cooperative.        Thought Content: Thought content normal.        Judgment: Judgment normal.     Results for orders placed or performed in visit on 04/04/22  HM MAMMOGRAPHY  Result Value Ref Range   HM Mammogram 0-4 Bi-Rad 0-4 Bi-Rad, Self Reported Normal  HM DEXA SCAN  Result Value Ref Range   HM Dexa Scan see report in chart       Assessment & Plan:   Problem List Items Addressed This Visit       Other   Encounter for smoking cessation counseling - Primary    Will start chantix. Has done well in the past with this medication.  Side effects and benefits of medication during visit.  Recommend picking a day in 2 weeks and stopping smoking.  Discussed how to titrate medication.  Follow up in 1 month for reevaluation.  Call sooner if concerns arise.        Follow up plan: Return in about 1 month (around 07/14/2022) for Smoking Cessation.  This visit was completed via MyChart due to the restrictions of the COVID-19 pandemic. All issues as above were discussed and addressed. Physical exam was done as above through visual confirmation on MyChart. If it was felt that the patient should be evaluated in the office, they were directed there. The patient verbally consented to this visit. Location of the patient: Home Location of the provider: Office Those involved with this call:  Provider: Larae Grooms, NP CMA: Anitra Lauth, CMA Front Desk/Registration: Servando Snare This encounter was conducted via video.  I spent 30 dedicated to the care of this patient on the date of this encounter to include previsit review of medications, plan for smoking cessation, and  follow up, face to face time with the patient, and post visit ordering of testing.

## 2022-06-12 NOTE — Telephone Encounter (Signed)
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

## 2022-06-12 NOTE — Telephone Encounter (Signed)
Patient is scheduled tmrw for virtual (OK by K. Holdsworth) visit.

## 2022-06-13 ENCOUNTER — Encounter: Payer: Self-pay | Admitting: Nurse Practitioner

## 2022-06-13 ENCOUNTER — Telehealth (INDEPENDENT_AMBULATORY_CARE_PROVIDER_SITE_OTHER): Payer: BC Managed Care – PPO | Admitting: Nurse Practitioner

## 2022-06-13 DIAGNOSIS — Z716 Tobacco abuse counseling: Secondary | ICD-10-CM

## 2022-06-13 DIAGNOSIS — F17210 Nicotine dependence, cigarettes, uncomplicated: Secondary | ICD-10-CM

## 2022-06-13 MED ORDER — VARENICLINE TARTRATE (STARTER) 0.5 MG X 11 & 1 MG X 42 PO TBPK: ORAL_TABLET | ORAL | 0 refills | Status: DC

## 2022-06-13 NOTE — Assessment & Plan Note (Signed)
Will start chantix. Has done well in the past with this medication.  Side effects and benefits of medication during visit.  Recommend picking a day in 2 weeks and stopping smoking.  Discussed how to titrate medication.  Follow up in 1 month for reevaluation.  Call sooner if concerns arise.

## 2022-06-27 NOTE — Progress Notes (Unsigned)
There were no vitals taken for this visit.   Subjective:    Patient ID: Kara Giles, female    DOB: 13-Jul-1961, 61 y.o.   MRN: [id number]  HPI: Kara Giles is a 61 y.o. female presenting on 06/28/2022 for comprehensive medical examination. Current medical complaints include:{Blank single:19197::"none","***"}  She currently lives with: Menopausal Symptoms: {Blank single:19197::"yes","no"}  HYPERTENSION / HYPERLIPIDEMIA Satisfied with current treatment? {Blank single:19197::"yes","no"} Duration of hypertension: {Blank single:19197::"chronic","months","years"} BP monitoring frequency: {Blank single:19197::"not checking","rarely","daily","weekly","monthly","a few times a day","a few times a week","a few times a month"} BP range:  BP medication side effects: {Blank single:19197::"yes","no"} Past BP meds: {Blank multiple:19196::"none","amlodipine","amlodipine/benazepril","atenolol","benazepril","benazepril/HCTZ","bisoprolol (bystolic)","carvedilol","chlorthalidone","clonidine","diltiazem","exforge HCT","HCTZ","irbesartan (avapro)","labetalol","lisinopril","lisinopril-HCTZ","losartan (cozaar)","methyldopa","nifedipine","olmesartan (benicar)","olmesartan-HCTZ","quinapril","ramipril","spironalactone","tekturna","valsartan","valsartan-HCTZ","verapamil"} Duration of hyperlipidemia: {Blank single:19197::"chronic","months","years"} Cholesterol medication side effects: {Blank single:19197::"yes","no"} Cholesterol supplements: {Blank multiple:19196::"none","fish oil","niacin","red yeast rice"} Past cholesterol medications: {Blank multiple:19196::"none","atorvastain (lipitor)","lovastatin (mevacor)","pravastatin (pravachol)","rosuvastatin (crestor)","simvastatin (zocor)","vytorin","fenofibrate (tricor)","gemfibrozil","ezetimide (zetia)","niaspan","lovaza"} Medication compliance: {Blank single:19197::"excellent compliance","good compliance","fair compliance","poor compliance"} Aspirin: {Blank  single:19197::"yes","no"} Recent stressors: {Blank single:19197::"yes","no"} Recurrent headaches: {Blank single:19197::"yes","no"} Visual changes: {Blank single:19197::"yes","no"} Palpitations: {Blank single:19197::"yes","no"} Dyspnea: {Blank single:19197::"yes","no"} Chest pain: {Blank single:19197::"yes","no"} Lower extremity edema: {Blank single:19197::"yes","no"} Dizzy/lightheaded: {Blank single:19197::"yes","no"}  DIABETES Hypoglycemic episodes:{Blank single:19197::"yes","no"} Polydipsia/polyuria: {Blank single:19197::"yes","no"} Visual disturbance: {Blank single:19197::"yes","no"} Chest pain: {Blank single:19197::"yes","no"} Paresthesias: {Blank single:19197::"yes","no"} Glucose Monitoring: {Blank single:19197::"yes","no"}  Accucheck frequency: {Blank single:19197::"Not Checking","Daily","BID","TID"}  Fasting glucose:  Post prandial:  Evening:  Before meals: Taking Insulin?: {Blank single:19197::"yes","no"}  Long acting insulin:  Short acting insulin: Blood Pressure Monitoring: {Blank single:19197::"not checking","rarely","daily","weekly","monthly","a few times a day","a few times a week","a few times a month"} Retinal Examination: {Blank single:19197::"Up to Date","Not up to Date"} Foot Exam: {Blank single:19197::"Up to Date","Not up to Date"} Diabetic Education: {Blank single:19197::"Completed","Not Completed"} Pneumovax: {Blank single:19197::"Up to Date","Not up to Date","unknown"} Influenza: {Blank single:19197::"Up to Date","Not up to Date","unknown"} Aspirin: {Blank single:19197::"yes","no"}  HYPOTHYROIDISM Thyroid control status:{Blank single:19197::"controlled","uncontrolled","better","worse","exacerbated","stable"} Satisfied with current treatment? {Blank single:19197::"yes","no"} Medication side effects: {Blank single:19197::"yes","no"} Medication compliance: {Blank single:19197::"excellent compliance","good compliance","fair compliance","poor  compliance"} Etiology of hypothyroidism:  Recent dose adjustment:{Blank single:19197::"yes","no"} Fatigue: {Blank single:19197::"yes","no"} Cold intolerance: {Blank single:19197::"yes","no"} Heat intolerance: {Blank single:19197::"yes","no"} Weight gain: {Blank single:19197::"yes","no"} Weight loss: {Blank single:19197::"yes","no"} Constipation: {Blank single:19197::"yes","no"} Diarrhea/loose stools: {Blank single:19197::"yes","no"} Palpitations: {Blank single:19197::"yes","no"} Lower extremity edema: {Blank single:19197::"yes","no"} Anxiety/depressed mood: {Blank single:19197::"yes","no"}  SMOKING CESSATION Smoking Status: Smoking Amount: Smoking Onset:  Smoking Quit Date:  Smoking triggers: Type of tobacco use:  Children in the house: {Blank single:19197::"yes","no"} Other household members who smoke: {Blank single:19197::"yes","no"} Treatments attempted:  Pneumovax:     MOOD Depression Screen done today and results listed below:     03/28/2022    9:10 AM 02/15/2022    3:45 PM  Depression screen PHQ 2/9  Decreased Interest 0 0  Down, Depressed, Hopeless 0 0  PHQ - 2 Score 0 0  Altered sleeping 1 0  Tired, decreased energy 0 3  Change in appetite 0 0  Feeling bad or failure about yourself  0 0  Trouble concentrating 0 0  Moving slowly or fidgety/restless 0 0  Suicidal thoughts 0 0  PHQ-9 Score 1 3  Difficult doing work/chores Not difficult at all Not difficult at all    The patient {has/does not NATF:57322} a history of falls. I {did/did not:19850} complete a risk assessment for falls. A plan of care for falls {was/was not:19852} documented.   Past Medical History:  Past Medical History:  Diagnosis Date   Diabetes mellitus without complication (HCC)    Hypertension    Thyroid disease     Surgical History:  Past Surgical History:  Procedure Laterality Date   TOTAL ABDOMINAL HYSTERECTOMY  Medications:  Current Outpatient Medications on File Prior to  Visit  Medication Sig   amLODipine (NORVASC) 2.5 MG tablet    atenolol (TENORMIN) 50 MG tablet Take 50 mg by mouth 2 (two) times daily.   doxepin (SINEQUAN) 10 MG capsule SMARTSIG:1 Capsule(s) By Mouth Every Evening   Dulaglutide (TRULICITY) 5.80 DX/8.3JA SOPN Inject 0.75 mg as directed once a week.   DULoxetine (CYMBALTA) 20 MG capsule Take by mouth.   esomeprazole (NEXIUM) 40 MG capsule Take 1 tablet by mouth daily.   hydrochlorothiazide (HYDRODIURIL) 12.5 MG tablet Take 1 tablet by mouth daily.   latanoprost (XALATAN) 0.005 % ophthalmic solution 1 drop at bedtime.   levothyroxine (SYNTHROID) 75 MCG tablet Take 1 tablet by mouth daily.   losartan (COZAAR) 100 MG tablet    metFORMIN (GLUCOPHAGE) 1000 MG tablet Take 1,000 mg by mouth 2 (two) times daily.   ondansetron (ZOFRAN-ODT) 4 MG disintegrating tablet Take 1 tablet (4 mg total) by mouth every 8 (eight) hours as needed for nausea or vomiting.   rosuvastatin (CRESTOR) 40 MG tablet Take 40 mg by mouth daily.   Varenicline Tartrate, Starter, (CHANTIX STARTING MONTH PAK) 0.5 MG X 11 & 1 MG X 42 TBPK Take one 0.5 mg tablet by mouth once daily for 3 days, then increase to one 0.5 mg tablet twice daily for 4 days, then increase to one 1 mg tablet twice daily.   No current facility-administered medications on file prior to visit.    Allergies:  Allergies  Allergen Reactions   Lisinopril Cough and Shortness Of Breath    Coughing     Social History:  Social History   Socioeconomic History   Marital status: Married    Spouse name: Not on file   Number of children: Not on file   Years of education: Not on file   Highest education level: Not on file  Occupational History   Not on file  Tobacco Use   Smoking status: Every Day    Packs/day: 1.00    Types: Cigarettes   Smokeless tobacco: Never  Vaping Use   Vaping Use: Never used  Substance and Sexual Activity   Alcohol use: Not Currently   Drug use: Never   Sexual activity: Not  Currently  Other Topics Concern   Not on file  Social History Narrative   Not on file   Social Determinants of Health   Financial Resource Strain: Not on file  Food Insecurity: Not on file  Transportation Needs: Not on file  Physical Activity: Not on file  Stress: Not on file  Social Connections: Not on file  Intimate Partner Violence: Not on file   Social History   Tobacco Use  Smoking Status Every Day   Packs/day: 1.00   Types: Cigarettes  Smokeless Tobacco Never   Social History   Substance and Sexual Activity  Alcohol Use Not Currently    Family History:  No family history on file.  Past medical history, surgical history, medications, allergies, family history and social history reviewed with patient today and changes made to appropriate areas of the chart.   ROS All other ROS negative except what is listed above and in the HPI.      Objective:    There were no vitals taken for this visit.  Wt Readings from Last 3 Encounters:  03/28/22 189 lb 4.8 oz (85.9 kg)  02/15/22 187 lb 9.6 oz (85.1 kg)  01/21/22 198 lb (89.8 kg)    Physical Exam  Results for orders placed or performed in visit on 04/04/22  HM MAMMOGRAPHY  Result Value Ref Range   HM Mammogram 0-4 Bi-Rad 0-4 Bi-Rad, Self Reported Normal  HM DEXA SCAN  Result Value Ref Range   HM Dexa Scan see report in chart       Assessment & Plan:   Problem List Items Addressed This Visit   None    Follow up plan: No follow-ups on file.   LABORATORY TESTING:  - Pap smear: {Blank single:19197::"pap done","not applicable","up to date","done elsewhere"}  IMMUNIZATIONS:   - Tdap: Tetanus vaccination status reviewed: {tetanus status:315746}. - Influenza: {Blank single:19197::"Up to date","Administered today","Postponed to flu season","Refused","Given elsewhere"} - Pneumovax: {Blank single:19197::"Up to date","Administered today","Not applicable","Refused","Given elsewhere"} - Prevnar: {Blank  single:19197::"Up to date","Administered today","Not applicable","Refused","Given elsewhere"} - COVID: {Blank single:19197::"Up to date","Administered today","Not applicable","Refused","Given elsewhere"} - HPV: {Blank single:19197::"Up to date","Administered today","Not applicable","Refused","Given elsewhere"} - Shingrix vaccine: {Blank single:19197::"Up to date","Administered today","Not applicable","Refused","Given elsewhere"}  SCREENING: -Mammogram: {Blank single:19197::"Up to date","Ordered today","Not applicable","Refused","Done elsewhere"}  - Colonoscopy: {Blank single:19197::"Up to date","Ordered today","Not applicable","Refused","Done elsewhere"}  - Bone Density: {Blank single:19197::"Up to date","Ordered today","Not applicable","Refused","Done elsewhere"}  -Hearing Test: {Blank single:19197::"Up to date","Ordered today","Not applicable","Refused","Done elsewhere"}  -Spirometry: {Blank single:19197::"Up to date","Ordered today","Not applicable","Refused","Done elsewhere"}   PATIENT COUNSELING:   Advised to take 1 mg of folate supplement per day if capable of pregnancy.   Sexuality: Discussed sexually transmitted diseases, partner selection, use of condoms, avoidance of unintended pregnancy  and contraceptive alternatives.   Advised to avoid cigarette smoking.  I discussed with the patient that most people either abstain from alcohol or drink within safe limits (<=14/week and <=4 drinks/occasion for males, <=7/weeks and <= 3 drinks/occasion for females) and that the risk for alcohol disorders and other health effects rises proportionally with the number of drinks per week and how often a drinker exceeds daily limits.  Discussed cessation/primary prevention of drug use and availability of treatment for abuse.   Diet: Encouraged to adjust caloric intake to maintain  or achieve ideal body weight, to reduce intake of dietary saturated fat and total fat, to limit sodium intake by avoiding  high sodium foods and not adding table salt, and to maintain adequate dietary potassium and calcium preferably from fresh fruits, vegetables, and low-fat dairy products.    stressed the importance of regular exercise  Injury prevention: Discussed safety belts, safety helmets, smoke detector, smoking near bedding or upholstery.   Dental health: Discussed importance of regular tooth brushing, flossing, and dental visits.    NEXT PREVENTATIVE PHYSICAL DUE IN 1 YEAR. No follow-ups on file.

## 2022-06-28 ENCOUNTER — Ambulatory Visit (INDEPENDENT_AMBULATORY_CARE_PROVIDER_SITE_OTHER): Payer: BC Managed Care – PPO | Admitting: Nurse Practitioner

## 2022-06-28 ENCOUNTER — Encounter: Payer: Self-pay | Admitting: Nurse Practitioner

## 2022-06-28 VITALS — BP 113/61 | HR 83 | Temp 98.3°F | Wt 200.8 lb

## 2022-06-28 DIAGNOSIS — F419 Anxiety disorder, unspecified: Secondary | ICD-10-CM

## 2022-06-28 DIAGNOSIS — Z23 Encounter for immunization: Secondary | ICD-10-CM

## 2022-06-28 DIAGNOSIS — E039 Hypothyroidism, unspecified: Secondary | ICD-10-CM

## 2022-06-28 DIAGNOSIS — I10 Essential (primary) hypertension: Secondary | ICD-10-CM

## 2022-06-28 DIAGNOSIS — E782 Mixed hyperlipidemia: Secondary | ICD-10-CM | POA: Diagnosis not present

## 2022-06-28 DIAGNOSIS — Z716 Tobacco abuse counseling: Secondary | ICD-10-CM

## 2022-06-28 DIAGNOSIS — Z0000 Encounter for general adult medical examination without abnormal findings: Secondary | ICD-10-CM

## 2022-06-28 DIAGNOSIS — R8290 Unspecified abnormal findings in urine: Secondary | ICD-10-CM

## 2022-06-28 DIAGNOSIS — E119 Type 2 diabetes mellitus without complications: Secondary | ICD-10-CM

## 2022-06-28 LAB — MICROSCOPIC EXAMINATION: Bacteria, UA: NONE SEEN

## 2022-06-28 LAB — MICROALBUMIN, URINE WAIVED
Creatinine, Urine Waived: 50 mg/dL (ref 10–300)
Microalb, Ur Waived: 10 mg/L (ref 0–19)

## 2022-06-28 LAB — URINALYSIS, ROUTINE W REFLEX MICROSCOPIC
Bilirubin, UA: NEGATIVE
Glucose, UA: NEGATIVE
Ketones, UA: NEGATIVE
Nitrite, UA: NEGATIVE
Protein,UA: NEGATIVE
RBC, UA: NEGATIVE
Specific Gravity, UA: <1.005 — ABNORMAL LOW (ref 1.005–1.030)
Urobilinogen, Ur: 0.2 mg/dL (ref 0.2–1.0)
pH, UA: 5.5 (ref 5.0–7.5)

## 2022-06-28 NOTE — Assessment & Plan Note (Signed)
Pt requests coming off Cymbalta. Pt reports mood is great now at new job and less stress and anxiety. Plan to taper with taking 20mg  cymbalta every other day for 2 weeks, then dropping frequency to 20mg  cymbalta every 3 days. Pt encouraged to reach out to clinic if experiencing worsening anxiety.

## 2022-06-28 NOTE — Assessment & Plan Note (Signed)
Chronic, stable.  No changes to current medication regimen at this time.  Will draw BMP to assess renal function.

## 2022-06-28 NOTE — Assessment & Plan Note (Signed)
Last cigarette 11/5.  Pt is continuing to attempt and maintain smoking cessation. Verbalized worsened SOB when she restarted smoking and is motivated to stop.  Pt states her stress level is less and exposure or seeing others smoke is more of a trigger. Supportive family, no other smoker at home. Pt encouraged to keep up the good work.

## 2022-06-28 NOTE — Addendum Note (Signed)
Addended by: Larae Grooms on: 06/28/2022 10:48 AM   Modules accepted: Orders

## 2022-06-28 NOTE — Assessment & Plan Note (Signed)
Chronic, stable.  No symptoms noted today.  TSH drawn today. Medication adjustments to be completed based on results. Will notify pt with changes.

## 2022-06-28 NOTE — Assessment & Plan Note (Signed)
Chronic, stable. Lipid panel drawn today to assess need for medication adjustment.  Will contact patient if needs for dose change is noted.

## 2022-06-28 NOTE — Assessment & Plan Note (Addendum)
Chronic, stable. No neuropathies noted, Diabetic foot exam completed today.  Eye exam up to date, next due Jan 2024. Will assess A1C today and make adjustments to medications as appropriate. Pt encouraged to maintain adequate hydration.

## 2022-06-29 LAB — LIPID PANEL
Chol/HDL Ratio: 3.1 ratio (ref 0.0–4.4)
Cholesterol, Total: 113 mg/dL (ref 100–199)
HDL: 37 mg/dL — ABNORMAL LOW (ref >39)
LDL Chol Calc (NIH): 39 mg/dL (ref 0–99)
Triglycerides: 236 mg/dL — ABNORMAL HIGH (ref 0–149)
VLDL Cholesterol Cal: 37 mg/dL (ref 5–40)

## 2022-06-29 LAB — COMPREHENSIVE METABOLIC PANEL
ALT: 25 IU/L (ref 0–32)
AST: 21 IU/L (ref 0–40)
Albumin/Globulin Ratio: 2.1 (ref 1.2–2.2)
Albumin: 4.7 g/dL (ref 3.8–4.9)
Alkaline Phosphatase: 72 IU/L (ref 44–121)
BUN/Creatinine Ratio: 18 (ref 9–23)
BUN: 20 mg/dL (ref 6–24)
Bilirubin Total: 0.4 mg/dL (ref 0.0–1.2)
CO2: 26 mmol/L (ref 20–29)
Calcium: 10 mg/dL (ref 8.7–10.2)
Chloride: 96 mmol/L (ref 96–106)
Creatinine, Ser: 1.1 mg/dL — ABNORMAL HIGH (ref 0.57–1.00)
Globulin, Total: 2.2 g/dL (ref 1.5–4.5)
Glucose: 207 mg/dL — ABNORMAL HIGH (ref 70–99)
Potassium: 3.9 mmol/L (ref 3.5–5.2)
Sodium: 137 mmol/L (ref 134–144)
Total Protein: 6.9 g/dL (ref 6.0–8.5)
eGFR: 58 mL/min/{1.73_m2} — ABNORMAL LOW (ref >59)

## 2022-06-29 LAB — CBC WITH DIFFERENTIAL/PLATELET
Basophils Absolute: 0.1 10*3/uL (ref 0.0–0.2)
Basos: 1 %
EOS (ABSOLUTE): 0.4 10*3/uL (ref 0.0–0.4)
Eos: 4 %
Hematocrit: 37.9 % (ref 34.0–46.6)
Hemoglobin: 12.5 g/dL (ref 11.1–15.9)
Immature Grans (Abs): 0 10*3/uL (ref 0.0–0.1)
Immature Granulocytes: 0 %
Lymphocytes Absolute: 3.4 10*3/uL — ABNORMAL HIGH (ref 0.7–3.1)
Lymphs: 35 %
MCH: 30.1 pg (ref 26.6–33.0)
MCHC: 33 g/dL (ref 31.5–35.7)
MCV: 91 fL (ref 79–97)
Monocytes Absolute: 0.9 10*3/uL (ref 0.1–0.9)
Monocytes: 9 %
Neutrophils Absolute: 5.1 10*3/uL (ref 1.4–7.0)
Neutrophils: 51 %
Platelets: 228 10*3/uL (ref 150–450)
RBC: 4.15 x10E6/uL (ref 3.77–5.28)
RDW: 12 % (ref 11.7–15.4)
WBC: 9.8 10*3/uL (ref 3.4–10.8)

## 2022-06-29 LAB — HEMOGLOBIN A1C
Est. average glucose Bld gHb Est-mCnc: 174 mg/dL
Hgb A1c MFr Bld: 7.7 % — ABNORMAL HIGH (ref 4.8–5.6)

## 2022-06-29 LAB — TSH: TSH: 3.05 u[IU]/mL (ref 0.450–4.500)

## 2022-06-29 NOTE — Progress Notes (Signed)
HI Kara Giles.  It was nice to see you yesterday.  Your lab work looks good.  Your A1c did increase to 7.7.  I recommend we increase your Trulicity to 1.5mg  weekly.  If you agree, I will send this to the pharmacy for you.  Your triglycerides also went up some but likely due to your sugar being elevated.  We will recheck this at next visit. No other concerns at this time. Continue with your current medication regimen.  I would like to see you back in 3 months instead of 6.  Please move her appt up.  Please let me know if you have any questions.

## 2022-06-29 NOTE — Telephone Encounter (Unsigned)
Copied from CRM [id number]. Topic: General - Other >> Jun 29, 2022  9:24 AM Everette C wrote: Reason for CRM: The patient has called to share that they have received the message about increasing their medications and would like to be contacted further by phone if needed

## 2022-06-29 NOTE — Telephone Encounter (Signed)
Attemped to call patient returning her call about her medications. Patient did not answer. LVMTRC.   OK for PEC/Nurse Triage to speak with patient if she calls back.

## 2022-07-01 LAB — URINE CULTURE

## 2022-07-03 ENCOUNTER — Other Ambulatory Visit: Payer: Self-pay | Admitting: Nurse Practitioner

## 2022-07-03 NOTE — Progress Notes (Signed)
Hi Kara Giles.  Your urine grew beta hemolytic strep.  This can be a normal bacteria found in the urinary tract.  Since you aren't symptomatic there is no need for antibiotic treatment at this time.

## 2022-07-04 NOTE — Telephone Encounter (Signed)
I changed the prescription to the monthly continuation dose instead of the starter pack.

## 2022-07-04 NOTE — Telephone Encounter (Signed)
Requested medications are due for refill today.  yes  Requested medications are on the active medications list.  yes  Last refill. 06/13/2022 53 0rf  Future visit scheduled.   yes  Notes to clinic.  This rx request is for another "starter pack". Please review for refill.    Requested Prescriptions  Pending Prescriptions Disp Refills   Varenicline Tartrate, Starter, 0.5 MG X 11 & 1 MG X 42 TBPK [Pharmacy Med Name: VARENICLINE STARTING MONTH BOX] 53 each 0    Sig: Take one 0.5 mg tablet by mouth once daily for 3 days, then increase to one 0.5 mg tablet twice daily for 4 days, then increase to one 1 mg tablet twice daily.     Psychiatry:  Drug Dependence Therapy - varenicline Failed - 07/03/2022  9:11 AM      Failed - Manual Review: Do not refill starter pack. 1 mg tabs may be extended up to one year if the patient has quit smoking but still feels at risk for relapse.      Failed - Cr in normal range and within 180 days    Creatinine  Date Value Ref Range Status  10/02/2013 0.76 0.60 - 1.30 mg/dL Final   Creatinine, Ser  Date Value Ref Range Status  06/28/2022 1.10 (H) 0.57 - 1.00 mg/dL Final         Passed - Completed PHQ-2 or PHQ-9 in the last 360 days      Passed - Valid encounter within last 6 months    Recent Outpatient Visits           6 days ago Annual physical exam   [facility] Larae Grooms, NP   3 weeks ago Encounter for smoking cessation counseling   [facility] Larae Grooms, NP   3 months ago Encounter for smoking cessation counseling   [facility] [place], Clydie Braun, NP   4 months ago Controlled type 2 diabetes mellitus without complication, without long-term current use of insulin (HCC)   [facility] Larae Grooms, NP       Future Appointments             In 5 months Larae Grooms, NP [facility] [place], [facility]

## 2022-07-04 NOTE — Progress Notes (Signed)
Patient has seen results on mychart

## 2022-08-06 DIAGNOSIS — I10 Essential (primary) hypertension: Secondary | ICD-10-CM | POA: Diagnosis not present

## 2022-08-06 DIAGNOSIS — Z7984 Long term (current) use of oral hypoglycemic drugs: Secondary | ICD-10-CM | POA: Diagnosis not present

## 2022-08-06 DIAGNOSIS — Z8249 Family history of ischemic heart disease and other diseases of the circulatory system: Secondary | ICD-10-CM | POA: Diagnosis not present

## 2022-08-06 DIAGNOSIS — K219 Gastro-esophageal reflux disease without esophagitis: Secondary | ICD-10-CM | POA: Diagnosis not present

## 2022-08-06 DIAGNOSIS — F17210 Nicotine dependence, cigarettes, uncomplicated: Secondary | ICD-10-CM | POA: Diagnosis not present

## 2022-08-06 DIAGNOSIS — Z888 Allergy status to other drugs, medicaments and biological substances status: Secondary | ICD-10-CM | POA: Diagnosis not present

## 2022-08-06 DIAGNOSIS — E7800 Pure hypercholesterolemia, unspecified: Secondary | ICD-10-CM | POA: Diagnosis not present

## 2022-08-06 DIAGNOSIS — E039 Hypothyroidism, unspecified: Secondary | ICD-10-CM | POA: Diagnosis not present

## 2022-08-06 DIAGNOSIS — M545 Low back pain, unspecified: Secondary | ICD-10-CM | POA: Diagnosis not present

## 2022-08-06 DIAGNOSIS — Z79899 Other long term (current) drug therapy: Secondary | ICD-10-CM | POA: Diagnosis not present

## 2022-08-06 DIAGNOSIS — Z79890 Hormone replacement therapy (postmenopausal): Secondary | ICD-10-CM | POA: Diagnosis not present

## 2022-08-06 DIAGNOSIS — G4733 Obstructive sleep apnea (adult) (pediatric): Secondary | ICD-10-CM | POA: Diagnosis not present

## 2022-08-06 DIAGNOSIS — E119 Type 2 diabetes mellitus without complications: Secondary | ICD-10-CM | POA: Diagnosis not present

## 2022-08-11 ENCOUNTER — Other Ambulatory Visit: Payer: Self-pay

## 2022-08-11 MED ORDER — AMLODIPINE BESYLATE 2.5 MG PO TABS: 2.5000 mg | ORAL_TABLET | Freq: Every day | ORAL | 1 refills | Status: DC

## 2022-08-11 MED ORDER — LOSARTAN POTASSIUM 100 MG PO TABS: 100.0000 mg | ORAL_TABLET | Freq: Every day | ORAL | 1 refills | Status: DC

## 2022-08-11 MED ORDER — TRULICITY 0.75 MG/0.5ML ~~LOC~~ SOAJ: 0.7500 mg | SUBCUTANEOUS | 0 refills | Status: DC

## 2022-08-11 MED ORDER — DULOXETINE HCL 20 MG PO CPEP: 20.0000 mg | ORAL_CAPSULE | Freq: Every day | ORAL | 1 refills | Status: DC

## 2022-08-11 MED ORDER — VARENICLINE TARTRATE 1 MG PO TABS: 1.0000 mg | ORAL_TABLET | Freq: Two times a day (BID) | ORAL | 0 refills | Status: DC

## 2022-08-11 MED ORDER — HYDROCHLOROTHIAZIDE 12.5 MG PO TABS: 12.5000 mg | ORAL_TABLET | Freq: Every day | ORAL | 1 refills | Status: DC

## 2022-08-11 MED ORDER — ESOMEPRAZOLE MAGNESIUM 40 MG PO CPDR: 40.0000 mg | DELAYED_RELEASE_CAPSULE | Freq: Every day | ORAL | 1 refills | Status: DC

## 2022-08-11 MED ORDER — ROSUVASTATIN CALCIUM 40 MG PO TABS: 40.0000 mg | ORAL_TABLET | Freq: Every day | ORAL | 1 refills | Status: DC

## 2022-08-11 MED ORDER — ATENOLOL 50 MG PO TABS: 50.0000 mg | ORAL_TABLET | Freq: Two times a day (BID) | ORAL | 1 refills | Status: DC

## 2022-08-11 MED ORDER — LEVOTHYROXINE SODIUM 75 MCG PO TABS: 75.0000 ug | ORAL_TABLET | Freq: Every day | ORAL | 1 refills | Status: DC

## 2022-08-15 ENCOUNTER — Telehealth: Payer: Self-pay | Admitting: Nurse Practitioner

## 2022-08-15 MED ORDER — OMEPRAZOLE 20 MG PO CPDR: 20.0000 mg | DELAYED_RELEASE_CAPSULE | Freq: Every day | ORAL | 1 refills | Status: AC

## 2022-08-15 NOTE — Telephone Encounter (Signed)
Do you want to change to a different medication that is listed and preferred?

## 2022-08-15 NOTE — Telephone Encounter (Signed)
Medication changed to Omeprazole.

## 2022-08-15 NOTE — Telephone Encounter (Signed)
Copied from CRM [id number]. Topic: General - Other >> Aug 15, 2022  9:42 AM Macon Large wrote: Reason for CRM: Monika Salk with [facility] reports that the Rx for esomeprazole (NEXIUM) 40 MG capsule is not covered by insurance and can not be approved with prior authorization. Lawson Fiscal reports that the insurance will cover either omeprazole or pantoprazole. Cb# [phone] Reference# [id number]

## 2022-08-18 ENCOUNTER — Other Ambulatory Visit: Payer: Self-pay

## 2022-08-18 MED ORDER — METFORMIN HCL 1000 MG PO TABS: 1000.0000 mg | ORAL_TABLET | Freq: Two times a day (BID) | ORAL | 1 refills | Status: DC

## 2022-09-03 ENCOUNTER — Other Ambulatory Visit: Payer: Self-pay | Admitting: Nurse Practitioner

## 2022-09-04 NOTE — Telephone Encounter (Signed)
Changed to local pharmacy  Requested Prescriptions  Pending Prescriptions Disp Refills   TRULICITY 2.83 MO/2.9UT SOPN [Pharmacy Med Name: TRULICITY 6.54 YT/0.3 ML PEN] 4 mL 0    Sig: INJECT 0.75 MG AS DIRECTED ONCE A WEEK.     Endocrinology:  Diabetes - GLP-1 Receptor Agonists Passed - 09/03/2022  8:35 AM      Passed - HBA1C is between 0 and 7.9 and within 180 days    Hemoglobin A1C  Date Value Ref Range Status  09/22/2011 7.5 (H) 4.2 - 6.3 % Final    Comment:    The American Diabetes Association recommends that a primary goal of therapy should be <7% and that physicians should reevaluate the treatment regimen in patients with HbA1c values consistently >8%.    Hgb A1c MFr Bld  Date Value Ref Range Status  06/28/2022 7.7 (H) 4.8 - 5.6 % Final    Comment:             Prediabetes: 5.7 - 6.4          Diabetes: >6.4          Glycemic control for adults with diabetes: <7.0          Passed - Valid encounter within last 6 months    Recent Outpatient Visits           2 months ago Annual physical exam   [facility] Jon Billings, NP   2 months ago Encounter for smoking cessation counseling   [facility] Jon Billings, NP   5 months ago Encounter for smoking cessation counseling   [facility] Jon Billings, NP   6 months ago Controlled type 2 diabetes mellitus without complication, without long-term current use of insulin ([place])   [facility] Jon Billings, NP       Future Appointments             In 3 months Jon Billings, NP [facility], [place]

## 2022-09-11 ENCOUNTER — Other Ambulatory Visit: Payer: Self-pay | Admitting: Nurse Practitioner

## 2022-09-12 NOTE — Telephone Encounter (Signed)
Requested medication (s) are due for refill today: yes  Requested medication (s) are on the active medication list: historical med  Last refill:  05/20/21  Future visit scheduled: yes  Notes to clinic:  historical provider   Requested Prescriptions  Pending Prescriptions Disp Refills   doxepin (SINEQUAN) 10 MG capsule [Pharmacy Med Name: DOXEPIN 10 MG CAPSULE] 30 capsule     Sig: TAKE 1 CAPSULE BY MOUTH EVERY EVENING.     Psychiatry:  Antidepressants - Heterocyclics (TCAs) Passed - 09/11/2022  9:36 AM      Passed - Valid encounter within last 6 months    Recent Outpatient Visits           2 months ago Annual physical exam   [place] Jon Billings, NP   3 months ago Encounter for smoking cessation counseling   [place] Jon Billings, NP   5 months ago Encounter for smoking cessation counseling   [place] Jon Billings, NP   6 months ago Controlled type 2 diabetes mellitus without complication, without long-term current use of insulin ([place])   [place] Jon Billings, NP       Future Appointments             In 3 months Jon Billings, NP [place], [place]           Psychiatry:  Antidepressants - Heterocyclics (TCAs) - doxepin Passed - 09/11/2022  9:36 AM      Passed - Valid encounter within last 12 months    Recent Outpatient Visits           2 months ago Annual physical exam   [place] Jon Billings, NP   3 months ago Encounter for smoking cessation counseling   [place] Jon Billings, NP   5 months ago Encounter for smoking cessation counseling   [place] Jon Billings, NP   6 months ago Controlled type 2 diabetes mellitus without complication, without long-term current use of insulin [facility])   [place] Jon Billings, NP       Future Appointments             In 3 months Jon Billings, NP [place], [facility]

## 2022-09-14 DIAGNOSIS — H40023 Open angle with borderline findings, high risk, bilateral: Secondary | ICD-10-CM | POA: Diagnosis not present

## 2022-09-14 DIAGNOSIS — H401130 Primary open-angle glaucoma, bilateral, stage unspecified: Secondary | ICD-10-CM | POA: Diagnosis not present

## 2022-10-19 ENCOUNTER — Ambulatory Visit: Payer: Self-pay

## 2022-10-19 NOTE — Telephone Encounter (Signed)
Message from Alm Bustard sent at 10/19/2022  9:57 AM EST  Summary: yeast infection?   Pt states that she think she has a yeast infection and would like for medication to be called in for her. I did offer her the 2pm appt today with PCP. 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  Please advise.         Called pt and LM on VM to call back to discuss sx.

## 2022-10-19 NOTE — Telephone Encounter (Addendum)
Message from Alm Bustard sent at 10/19/2022  9:57 AM EST  Summary: yeast infection?   Pt states that she think she has a yeast infection and would like for medication to be called in for her. I did offer her the 2pm appt today with PCP. 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  Please advise.        Called pt and LM on VM to call back. Advised pt will forward her problem and sx to PCP office to review.  Reason for Disposition . Third attempt to contact caller AND no contact made. Phone number verified.  Protocols used: No Contact or Duplicate Contact Call-A-AH

## 2022-10-19 NOTE — Telephone Encounter (Signed)
Called lmom for the pt to call the office to get scheduled for an appointment to be seen.

## 2022-10-19 NOTE — Telephone Encounter (Signed)
Summary: yeast infection?   Pt states that she think she has a yeast infection and would like for medication to be called in for her. I did offer her the 2pm appt today with PCP. 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  Please advise.      Called Pt  - Left message on machine to call back.

## 2022-10-23 NOTE — Telephone Encounter (Signed)
Pt has an appointment for 10/24/2022 with Santiago Glad.

## 2022-10-24 ENCOUNTER — Ambulatory Visit (INDEPENDENT_AMBULATORY_CARE_PROVIDER_SITE_OTHER): Payer: BC Managed Care – PPO | Admitting: Nurse Practitioner

## 2022-10-24 ENCOUNTER — Ambulatory Visit
Admission: RE | Admit: 2022-10-24 | Discharge: 2022-10-24 | Disposition: A | Discharge status: Home / Self Care | Payer: BC Managed Care – PPO | Source: Ambulatory Visit | Attending: Nurse Practitioner | Admitting: Nurse Practitioner

## 2022-10-24 ENCOUNTER — Encounter: Payer: Self-pay | Admitting: Nurse Practitioner

## 2022-10-24 VITALS — BP 136/73 | HR 88 | Temp 98.0°F | Wt 197.0 lb

## 2022-10-24 DIAGNOSIS — N898 Other specified noninflammatory disorders of vagina: Secondary | ICD-10-CM

## 2022-10-24 DIAGNOSIS — R8290 Unspecified abnormal findings in urine: Secondary | ICD-10-CM | POA: Diagnosis not present

## 2022-10-24 DIAGNOSIS — Z1231 Encounter for screening mammogram for malignant neoplasm of breast: Secondary | ICD-10-CM

## 2022-10-24 DIAGNOSIS — N760 Acute vaginitis: Secondary | ICD-10-CM

## 2022-10-24 DIAGNOSIS — B9689 Other specified bacterial agents as the cause of diseases classified elsewhere: Secondary | ICD-10-CM

## 2022-10-24 LAB — WET PREP FOR TRICH, YEAST, CLUE
Clue Cell Exam: POSITIVE — AB
Trichomonas Exam: NEGATIVE
Yeast Exam: NEGATIVE

## 2022-10-24 LAB — MICROSCOPIC EXAMINATION: Bacteria, UA: NONE SEEN

## 2022-10-24 LAB — URINALYSIS, ROUTINE W REFLEX MICROSCOPIC
Bilirubin, UA: NEGATIVE
Glucose, UA: NEGATIVE
Ketones, UA: NEGATIVE
Nitrite, UA: NEGATIVE
Specific Gravity, UA: 1.025 (ref 1.005–1.030)
Urobilinogen, Ur: 1 mg/dL (ref 0.2–1.0)
pH, UA: 7 (ref 5.0–7.5)

## 2022-10-24 MED ORDER — METRONIDAZOLE 500 MG PO TABS: 500.0000 mg | ORAL_TABLET | Freq: Two times a day (BID) | ORAL | 0 refills | Status: AC

## 2022-10-24 NOTE — Progress Notes (Signed)
BP 136/73   Pulse 88   Temp 98 F (36.7 C) (Oral)   Wt 197 lb (89.4 kg)   SpO2 98%   BMI 30.85 kg/m    Subjective:    Patient ID: Kara Giles, female    DOB: [birth year], 62 y.o.   MRN: [id number]  HPI: Kara Giles is a 62 y.o. female  Chief Complaint  Patient presents with   Vaginal Discharge    Pt states she has been noticing some vaginal discharge, low back pain, and pain with urination for the last week. States she has increased her water intake and has been drinking cranberry juice.    URINARY SYMPTOMS Symptoms started last week Dysuria: yes Urinary frequency: yes Urgency: yes Small volume voids: yes Symptom severity: no Urinary incontinence: no Foul odor: yes Hematuria: no Abdominal pain: yes Back pain: yes Suprapubic pain/pressure: no Flank pain: yes Fever:  no Vomiting: no Relief with cranberry juice: yes Relief with pyridium: no Status: stable Previous urinary tract infection: no Recurrent urinary tract infection: no Sexual activity: No sexually active/monogomous/practicing safe sex History of sexually transmitted disease: no Penile discharge: no Treatments attempted: cranberry and increasing fluids   Relevant past medical, surgical, family and social history reviewed and updated as indicated. Interim medical history since our last visit reviewed. Allergies and medications reviewed and updated.  Review of Systems  Constitutional:  Negative for fever.  Gastrointestinal:  Positive for abdominal pain. Negative for vomiting.  Genitourinary:  Positive for decreased urine volume, dysuria, frequency and urgency. Negative for flank pain and hematuria.  Musculoskeletal:  Positive for back pain.    Per HPI unless specifically indicated above     Objective:    BP 136/73   Pulse 88   Temp 98 F (36.7 C) (Oral)   Wt 197 lb (89.4 kg)   SpO2 98%   BMI 30.85 kg/m   Wt Readings from Last 3 Encounters:  10/24/22 197 lb (89.4 kg)  06/28/22 200 lb  12.8 oz (91.1 kg)  03/28/22 189 lb 4.8 oz (85.9 kg)    Physical Exam Vitals and nursing note reviewed.  Constitutional:      General: She is not in acute distress.    Appearance: Normal appearance. She is normal weight. She is not ill-appearing, toxic-appearing or diaphoretic.  HENT:     Head: Normocephalic.     Right Ear: External ear normal.     Left Ear: External ear normal.     Nose: Nose normal.     Mouth/Throat:     Mouth: Mucous membranes are moist.     Pharynx: Oropharynx is clear.  Eyes:     General:        Right eye: No discharge.        Left eye: No discharge.     Extraocular Movements: Extraocular movements intact.     Conjunctiva/sclera: Conjunctivae normal.     Pupils: Pupils are equal, round, and reactive to light.  Cardiovascular:     Rate and Rhythm: Normal rate and regular rhythm.     Heart sounds: No murmur heard. Pulmonary:     Effort: Pulmonary effort is normal. No respiratory distress.     Breath sounds: Normal breath sounds. No wheezing or rales.  Abdominal:     General: Abdomen is flat. Bowel sounds are normal. There is no distension.     Palpations: Abdomen is soft. There is no mass.     Tenderness: There is no abdominal tenderness. There is  no right CVA tenderness, left CVA tenderness, guarding or rebound.     Hernia: No hernia is present.  Musculoskeletal:     Cervical back: Normal range of motion and neck supple.  Skin:    General: Skin is warm and dry.     Capillary Refill: Capillary refill takes less than 2 seconds.  Neurological:     General: No focal deficit present.     Mental Status: She is alert and oriented to person, place, and time. Mental status is at baseline.  Psychiatric:        Mood and Affect: Mood normal.        Behavior: Behavior normal.        Thought Content: Thought content normal.        Judgment: Judgment normal.     Results for orders placed or performed in visit on 06/28/22  Microscopic Examination   Urine   Result Value Ref Range   WBC, UA 0-5 0 - 5 /hpf   RBC, Urine 0-2 0 - 2 /hpf   Epithelial Cells (non renal) 0-10 0 - 10 /hpf   Bacteria, UA None seen None seen/Few  Urine Culture   Specimen: Urine   UR  Result Value Ref Range   Urine Culture, Routine Final report (A)    Organism ID, Bacteria Comment (A)    ORGANISM ID, BACTERIA Not applicable   CBC with Differential/Platelet  Result Value Ref Range   WBC 9.8 3.4 - 10.8 x10E3/uL   RBC 4.15 3.77 - 5.28 x10E6/uL   Hemoglobin 12.5 11.1 - 15.9 g/dL   Hematocrit 37.9 34.0 - 46.6 %   MCV 91 79 - 97 fL   MCH 30.1 26.6 - 33.0 pg   MCHC 33.0 31.5 - 35.7 g/dL   RDW 12.0 11.7 - 15.4 %   Platelets 228 150 - 450 x10E3/uL   Neutrophils 51 Not Estab. %   Lymphs 35 Not Estab. %   Monocytes 9 Not Estab. %   Eos 4 Not Estab. %   Basos 1 Not Estab. %   Neutrophils Absolute 5.1 1.4 - 7.0 x10E3/uL   Lymphocytes Absolute 3.4 (H) 0.7 - 3.1 x10E3/uL   Monocytes Absolute 0.9 0.1 - 0.9 x10E3/uL   EOS (ABSOLUTE) 0.4 0.0 - 0.4 x10E3/uL   Basophils Absolute 0.1 0.0 - 0.2 x10E3/uL   Immature Granulocytes 0 Not Estab. %   Immature Grans (Abs) 0.0 0.0 - 0.1 x10E3/uL  Comprehensive metabolic panel  Result Value Ref Range   Glucose 207 (H) 70 - 99 mg/dL   BUN 20 6 - 24 mg/dL   Creatinine, Ser 1.10 (H) 0.57 - 1.00 mg/dL   eGFR 58 (L) >59 mL/min/1.73   BUN/Creatinine Ratio 18 9 - 23   Sodium 137 134 - 144 mmol/L   Potassium 3.9 3.5 - 5.2 mmol/L   Chloride 96 96 - 106 mmol/L   CO2 26 20 - 29 mmol/L   Calcium 10.0 8.7 - 10.2 mg/dL   Total Protein 6.9 6.0 - 8.5 g/dL   Albumin 4.7 3.8 - 4.9 g/dL   Globulin, Total 2.2 1.5 - 4.5 g/dL   Albumin/Globulin Ratio 2.1 1.2 - 2.2   Bilirubin Total 0.4 0.0 - 1.2 mg/dL   Alkaline Phosphatase 72 44 - 121 IU/L   AST 21 0 - 40 IU/L   ALT 25 0 - 32 IU/L  Lipid panel  Result Value Ref Range   Cholesterol, Total 113 100 - 199 mg/dL   Triglycerides 236 (H)  0 - 149 mg/dL   HDL 37 (L) >39 mg/dL   VLDL Cholesterol Cal  37 5 - 40 mg/dL   LDL Chol Calc (NIH) 39 0 - 99 mg/dL   Chol/HDL Ratio 3.1 0.0 - 4.4 ratio  TSH  Result Value Ref Range   TSH 3.050 0.450 - 4.500 uIU/mL  Urinalysis, Routine w reflex microscopic  Result Value Ref Range   Specific Gravity, UA <1.005 (L) 1.005 - 1.030   pH, UA 5.5 5.0 - 7.5   Color, UA Yellow Yellow   Appearance Ur Cloudy (A) Clear   Leukocytes,UA 1+ (A) Negative   Protein,UA Negative Negative/Trace   Glucose, UA Negative Negative   Ketones, UA Negative Negative   RBC, UA Negative Negative   Bilirubin, UA Negative Negative   Urobilinogen, Ur 0.2 0.2 - 1.0 mg/dL   Nitrite, UA Negative Negative   Microscopic Examination See below:   HgB A1c  Result Value Ref Range   Hgb A1c MFr Bld 7.7 (H) 4.8 - 5.6 %   Est. average glucose Bld gHb Est-mCnc 174 mg/dL  Microalbumin, Urine Waived  Result Value Ref Range   Microalb, Ur Waived 10 0 - 19 mg/L   Creatinine, Urine Waived 50 10 - 300 mg/dL   Microalb/Creat Ratio 30-300 (H) <30 mg/g      Assessment & Plan:   Problem List Items Addressed This Visit   None Visit Diagnoses     Bacterial vaginosis    -  Primary   Complete course of flagyl.  Follow up if symptoms not improved.   Relevant Medications   metroNIDAZOLE (FLAGYL) 500 MG tablet   Vaginal discharge       Relevant Orders   Urinalysis, Routine w reflex microscopic   WET PREP FOR TRICH, YEAST, CLUE   Abnormal urinalysis       Urine showed trace Leuks.  Sent for culture.  Will make recommendations based on lab results.   Relevant Orders   Urine Culture        Follow up plan: No follow-ups on file.

## 2022-10-25 ENCOUNTER — Inpatient Hospital Stay
Admission: RE | Admit: 2022-10-25 | Discharge: 2022-10-25 | Disposition: A | Discharge status: Home / Self Care | Payer: Self-pay | Source: Ambulatory Visit | Attending: *Deleted | Admitting: *Deleted

## 2022-10-25 ENCOUNTER — Other Ambulatory Visit: Payer: Self-pay | Admitting: *Deleted

## 2022-10-25 DIAGNOSIS — Z1231 Encounter for screening mammogram for malignant neoplasm of breast: Secondary | ICD-10-CM

## 2022-10-25 NOTE — Progress Notes (Signed)
Results discussed with patient during visit.

## 2022-10-26 LAB — URINE CULTURE

## 2022-10-26 NOTE — Progress Notes (Signed)
HI Margaretha Sheffield. Your urine culture did not grow any bacteria.  No further treatment needed.

## 2022-10-26 NOTE — Progress Notes (Signed)
Please let patient know her Mammogram did not show any evidence of a malignancy.  The recommendation is to repeat the Mammogram in 1 year.  

## 2022-10-30 ENCOUNTER — Other Ambulatory Visit: Payer: Self-pay | Admitting: Nurse Practitioner

## 2022-10-31 NOTE — Telephone Encounter (Signed)
Requested Prescriptions  Pending Prescriptions Disp Refills   TRULICITY [id number] SOPN [Pharmacy Med Name: TRULICITY SD PEN [id number] 4'S 0.'75MG'$  0.'75MG'$ ] 6 mL 0    Sig: INJECT 0.75 MG AS DIRECTED ONCE A WEEK     Endocrinology:  Diabetes - GLP-1 Receptor Agonists Passed - 10/30/2022  7:46 AM      Passed - HBA1C is between 0 and 7.9 and within 180 days    Hemoglobin A1C  Date Value Ref Range Status  09/22/2011 7.5 (H) 4.2 - 6.3 % Final    Comment:    The American Diabetes Association recommends that a primary goal of therapy should be <7% and that physicians should reevaluate the treatment regimen in patients with HbA1c values consistently >8%.    Hgb A1c MFr Bld  Date Value Ref Range Status  06/28/2022 7.7 (H) 4.8 - 5.6 % Final    Comment:             Prediabetes: 5.7 - 6.4          Diabetes: >6.4          Glycemic control for adults with diabetes: <7.0          Passed - Valid encounter within last 6 months    Recent Outpatient Visits           1 week ago Bacterial vaginosis   [place], NP   4 months ago Annual physical exam   [place] Jon Billings, NP   4 months ago Encounter for smoking cessation counseling   [place] Jon Billings, NP   7 months ago Encounter for smoking cessation counseling   [place] Jon Billings, NP   8 months ago Controlled type 2 diabetes mellitus without complication, without long-term current use of insulin [facility])   [place] Jon Billings, NP       Future Appointments             In 1 month Jon Billings, NP [place], [facility]

## 2022-11-01 ENCOUNTER — Telehealth: Payer: Self-pay | Admitting: Nurse Practitioner

## 2022-11-01 DIAGNOSIS — Z720 Tobacco use: Secondary | ICD-10-CM

## 2022-11-01 NOTE — Telephone Encounter (Unsigned)
Copied from [place] [id number]. Topic: Appointment Scheduling - Scheduling Inquiry for Clinic >> Nov 01, 2022  2:12 PM Erskine Squibb wrote: Reason for CRM: Kylie with the [facility] at [facility] called in stating the patients insurance requires a precert for her CT Chest/lung. Please do as soon as possible as patient is scheduled for Friday 03/15

## 2022-11-02 NOTE — Telephone Encounter (Signed)
I placed a new referral for [facility].  I'm not sure what else you need from me.

## 2022-11-02 NOTE — Telephone Encounter (Signed)
I started precert [id number] but office notes had to be faxed in. I do not think we will have an Auth number by tomorrow Did you intend for pt to do a lung cancer screening ?  If so referral should be ambulatory referral for lung cancer screening. When it is put in for the CT the pulmonary department usually cancels those orders and put in their own In this case it got scheduled at [facility] because of the way it was put in

## 2022-11-03 ENCOUNTER — Ambulatory Visit: Payer: BC Managed Care – PPO

## 2022-11-17 ENCOUNTER — Ambulatory Visit: Payer: Self-pay

## 2022-12-27 ENCOUNTER — Ambulatory Visit: Payer: Self-pay | Admitting: Nurse Practitioner

## 2022-12-27 NOTE — Progress Notes (Deleted)
There were no vitals taken for this visit.   Subjective:    Patient ID: Kara Giles, female    DOB: 1960-09-02, 62 y.o.   MRN: [id number]  HPI: Kara Giles is a 62 y.o. female  No chief complaint on file.  HYPERTENSION / HYPERLIPIDEMIA Satisfied with current treatment? {Blank single:19197::"yes","no"} Duration of hypertension: {Blank single:19197::"chronic","months","years"} BP monitoring frequency: {Blank single:19197::"not checking","rarely","daily","weekly","monthly","a few times a day","a few times a week","a few times a month"} BP range:  BP medication side effects: {Blank single:19197::"yes","no"} Past BP meds: {Blank multiple:19196::"none","amlodipine","amlodipine/benazepril","atenolol","benazepril","benazepril/HCTZ","bisoprolol (bystolic)","carvedilol","chlorthalidone","clonidine","diltiazem","exforge HCT","HCTZ","irbesartan (avapro)","labetalol","lisinopril","lisinopril-HCTZ","losartan (cozaar)","methyldopa","nifedipine","olmesartan (benicar)","olmesartan-HCTZ","quinapril","ramipril","spironalactone","tekturna","valsartan","valsartan-HCTZ","verapamil"} Duration of hyperlipidemia: {Blank single:19197::"chronic","months","years"} Cholesterol medication side effects: {Blank single:19197::"yes","no"} Cholesterol supplements: {Blank multiple:19196::"none","fish oil","niacin","red yeast rice"} Past cholesterol medications: {Blank multiple:19196::"none","atorvastain (lipitor)","lovastatin (mevacor)","pravastatin (pravachol)","rosuvastatin (crestor)","simvastatin (zocor)","vytorin","fenofibrate (tricor)","gemfibrozil","ezetimide (zetia)","niaspan","lovaza"} Medication compliance: {Blank single:19197::"excellent compliance","good compliance","fair compliance","poor compliance"} Aspirin: {Blank single:19197::"yes","no"} Recent stressors: {Blank single:19197::"yes","no"} Recurrent headaches: {Blank single:19197::"yes","no"} Visual changes: {Blank  single:19197::"yes","no"} Palpitations: {Blank single:19197::"yes","no"} Dyspnea: {Blank single:19197::"yes","no"} Chest pain: {Blank single:19197::"yes","no"} Lower extremity edema: {Blank single:19197::"yes","no"} Dizzy/lightheaded: {Blank single:19197::"yes","no"}  DIABETES Hypoglycemic episodes:{Blank single:19197::"yes","no"} Polydipsia/polyuria: {Blank single:19197::"yes","no"} Visual disturbance: {Blank single:19197::"yes","no"} Chest pain: {Blank single:19197::"yes","no"} Paresthesias: {Blank single:19197::"yes","no"} Glucose Monitoring: {Blank single:19197::"yes","no"}  Accucheck frequency: {Blank single:19197::"Not Checking","Daily","BID","TID"}  Fasting glucose:  Post prandial:  Evening:  Before meals: Taking Insulin?: {Blank single:19197::"yes","no"}  Long acting insulin:  Short acting insulin: Blood Pressure Monitoring: {Blank single:19197::"not checking","rarely","daily","weekly","monthly","a few times a day","a few times a week","a few times a month"} Retinal Examination: {Blank single:19197::"Up to Date","Not up to Date"} Foot Exam: {Blank single:19197::"Up to Date","Not up to Date"} Diabetic Education: {Blank single:19197::"Completed","Not Completed"} Pneumovax: {Blank single:19197::"Up to Date","Not up to Date","unknown"} Influenza: {Blank single:19197::"Up to Date","Not up to Date","unknown"} Aspirin: {Blank single:19197::"yes","no"}  ANXIETY  Relevant past medical, surgical, family and social history reviewed and updated as indicated. Interim medical history since our last visit reviewed. Allergies and medications reviewed and updated.  Review of Systems  Per HPI unless specifically indicated above     Objective:    There were no vitals taken for this visit.  Wt Readings from Last 3 Encounters:  10/24/22 197 lb (89.4 kg)  06/28/22 200 lb 12.8 oz (91.1 kg)  03/28/22 189 lb 4.8 oz (85.9 kg)    Physical Exam  Results for orders placed or performed in  visit on 10/24/22  WET PREP FOR TRICH, YEAST, CLUE   Specimen: Urine   Urine  Result Value Ref Range   Trichomonas Exam Negative Negative   Yeast Exam Negative Negative   Clue Cell Exam Positive (A) Negative  Urine Culture   Specimen: Urine   UR  Result Value Ref Range   Urine Culture, Routine Final report    Organism ID, Bacteria Comment   Microscopic Examination   Urine  Result Value Ref Range   WBC, UA 0-5 0 - 5 /hpf   RBC, Urine 0-2 0 - 2 /hpf   Epithelial Cells (non renal) 0-10 0 - 10 /hpf   Bacteria, UA None seen None seen/Few  Urinalysis, Routine w reflex microscopic  Result Value Ref Range   Specific Gravity, UA 1.025 1.005 - 1.030   pH, UA 7.0 5.0 - 7.5   Color, UA Yellow Yellow   Appearance Ur Clear Clear   Leukocytes,UA Trace (A) Negative   Protein,UA 3+ (A) Negative/Trace   Glucose, UA Negative Negative   Ketones, UA Negative Negative   RBC, UA Trace (A) Negative   Bilirubin, UA Negative Negative   Urobilinogen, Ur 1.0 0.2 - 1.0 mg/dL   Nitrite, UA Negative Negative   Microscopic Examination  See below:       Assessment & Plan:   Problem List Items Addressed This Visit       Cardiovascular and Mediastinum   Hypertension - Primary     Endocrine   Controlled type 2 diabetes mellitus without complication, without long-term current use of insulin (HCC)   Hypothyroidism, unspecified     Other   Anemia   Anxiety   Hyperlipidemia     Follow up plan: No follow-ups on file.

## 2023-01-09 ENCOUNTER — Other Ambulatory Visit: Payer: Self-pay | Admitting: Nurse Practitioner

## 2023-01-09 NOTE — Telephone Encounter (Signed)
Requested Prescriptions  Pending Prescriptions Disp Refills   TRULICITY 0.75 MG/0.5ML SOPN [Pharmacy Med Name: TRULICITY SD PEN 0.5ML 4'S 0.75MG  0.75MG ] 6 mL 1    Sig: INJECT 0.75 MG AS DIRECTED ONCE A WEEK     Endocrinology:  Diabetes - GLP-1 Receptor Agonists Failed - 01/09/2023 12:44 AM      Failed - HBA1C is between 0 and 7.9 and within 180 days    Hemoglobin A1C  Date Value Ref Range Status  09/22/2011 7.5 (H) 4.2 - 6.3 % Final    Comment:    The American Diabetes Association recommends that a primary goal of therapy should be <7% and that physicians should reevaluate the treatment regimen in patients with HbA1c values consistently >8%.    Hgb A1c MFr Bld  Date Value Ref Range Status  06/28/2022 7.7 (H) 4.8 - 5.6 % Final    Comment:             Prediabetes: 5.7 - 6.4          Diabetes: >6.4          Glycemic control for adults with diabetes: <7.0          Passed - Valid encounter within last 6 months    Recent Outpatient Visits           2 months ago Bacterial vaginosis   [place] [facility] Larae G[facility], NP   6 months ago Annual physical exam   [place] [facility] G[facility], NP   7 months ago Encounter for smoking cessation counseling   [place] [facility] Larae G[facility], NP   9 months ago Encounter for smoking cessation counseling   [place] [facility] Larae Grooms, NP   10 months ago Controlled type 2 diabetes mellitus without complication, without long-term current use of insulin [facility])   [place] [facility] Larae Grooms, NP

## 2023-01-15 ENCOUNTER — Other Ambulatory Visit: Payer: Self-pay | Admitting: Nurse Practitioner

## 2023-01-16 NOTE — Telephone Encounter (Signed)
Requested Prescriptions  Pending Prescriptions Disp Refills   rosuvastatin (CRESTOR) 40 MG tablet [Pharmacy Med Name: ROSUVASTATIN TABS 40MG ] 90 tablet 1    Sig: TAKE 1 TABLET DAILY     Cardiovascular:  Antilipid - Statins 2 Failed - 01/15/2023 12:51 AM      Failed - Cr in normal range and within 360 days    Creatinine  Date Value Ref Range Status  10/02/2013 0.76 0.60 - 1.30 mg/dL Final   Creatinine, Ser  Date Value Ref Range Status  06/28/2022 1.10 (H) 0.57 - 1.00 mg/dL Final         Failed - Lipid Panel in normal range within the last 12 months    Cholesterol, Total  Date Value Ref Range Status  06/28/2022 113 100 - 199 mg/dL Final   Cholesterol  Date Value Ref Range Status  09/23/2011 164 0 - 200 mg/dL Final   Ldl Cholesterol, Calc  Date Value Ref Range Status  09/23/2011 83 0 - 100 mg/dL Final   LDL Chol Calc (NIH)  Date Value Ref Range Status  06/28/2022 39 0 - 99 mg/dL Final   HDL Cholesterol  Date Value Ref Range Status  09/23/2011 24 (L) 40 - 60 mg/dL Final   HDL  Date Value Ref Range Status  06/28/2022 37 (L) >39 mg/dL Final   Triglycerides  Date Value Ref Range Status  06/28/2022 236 (H) 0 - 149 mg/dL Final  16/05/9603 540 (H) 0 - 200 mg/dL Final         Passed - Patient is not pregnant      Passed - Valid encounter within last 12 months    Recent Outpatient Visits           2 months ago Bacterial vaginosis   [place] [facility] Larae Grooms, NP   6 months ago Annual physical exam   [place] [facility] Larae Grooms, NP   7 months ago Encounter for smoking cessation counseling   [place] [facility] Larae Grooms, NP   9 months ago Encounter for smoking cessation counseling   [place] [facility] Larae Grooms, NP   11 months ago Controlled type 2 diabetes mellitus without complication, without long-term current use of insulin (HCC)   [place] [facility] Larae Grooms, NP               losartan (COZAAR) 100 MG tablet [Pharmacy Med Name: LOSARTAN TABS 100MG ] 90 tablet 0    Sig: TAKE 1 TABLET DAILY     Cardiovascular:  Angiotensin Receptor Blockers Failed - 01/15/2023 12:51 AM      Failed - Cr in normal range and within 180 days    Creatinine  Date Value Ref Range Status  10/02/2013 0.76 0.60 - 1.30 mg/dL Final   Creatinine, Ser  Date Value Ref Range Status  06/28/2022 1.10 (H) 0.57 - 1.00 mg/dL Final         Failed - K in normal range and within 180 days    Potassium  Date Value Ref Range Status  06/28/2022 3.9 3.5 - 5.2 mmol/L Final  10/02/2013 3.5 3.5 - 5.1 mmol/L Final         Passed - Patient is not pregnant      Passed - Last BP in normal range    BP Readings from Last 1 Encounters:  10/24/22 136/73         Passed - Valid encounter within last 6  months    Recent Outpatient Visits           2 months ago Bacterial vaginosis   [place] [facility] Larae Grooms, NP   6 months ago Annual physical exam   [place] [facility] Larae G[facility], NP   7 months ago Encounter for smoking cessation counseling   [place] [facility], NP   9 months ago Encounter for smoking cessation counseling   [place] [facility] Larae Grooms, NP   11 months ago Controlled type 2 diabetes mellitus without complication, without long-term current use of insulin (HCC)   [place] [facility] Larae Grooms, NP               levothyroxine (SYNTHROID) 75 MCG tablet [Pharmacy Med Name: L-THYROXINE (SYNTHROID) TABS 75MCG] 90 tablet 1    Sig: TAKE 1 TABLET DAILY     Endocrinology:  Hypothyroid Agents Passed - 01/15/2023 12:51 AM      Passed - TSH in normal range and within 360 days    TSH  Date Value Ref Range Status  06/28/2022 3.050 0.450 - 4.500 uIU/mL Final         Passed - Valid encounter within last 12  months    Recent Outpatient Visits           2 months ago Bacterial vaginosis   [place] [facility]   6 months ago Annual physical exam   [place] [facility] Larae Grooms, NP   7 months ago Encounter for smoking cessation counseling   [place] [facility] Larae Grooms, NP   9 months ago Encounter for smoking cessation counseling   [place] [facility] Larae Grooms, NP   11 months ago Controlled type 2 diabetes mellitus without complication, without long-term current use of insulin (HCC)   [place] [facility], Clydie Braun, NP               hydrochlorothiazide (HYDRODIURIL) 12.5 MG tablet [Pharmacy Med Name: HYDROCHLOROTHIAZIDE TABS 12.5MG ] 90 tablet 0    Sig: TAKE 1 TABLET DAILY     Cardiovascular: Diuretics - Thiazide Failed - 01/15/2023 12:51 AM      Failed - Cr in normal range and within 180 days    Creatinine  Date Value Ref Range Status  10/02/2013 0.76 0.60 - 1.30 mg/dL Final   Creatinine, Ser  Date Value Ref Range Status  06/28/2022 1.10 (H) 0.57 - 1.00 mg/dL Final         Failed - K in normal range and within 180 days    Potassium  Date Value Ref Range Status  06/28/2022 3.9 3.5 - 5.2 mmol/L Final  10/02/2013 3.5 3.5 - 5.1 mmol/L Final         Failed - Na in normal range and within 180 days    Sodium  Date Value Ref Range Status  06/28/2022 137 134 - 144 mmol/L Final  10/02/2013 135 (L) 136 - 145 mmol/L Final         Passed - Last BP in normal range    BP Readings from Last 1 Encounters:  10/24/22 136/73         Passed - Valid encounter within last 6 months    Recent Outpatient Visits           2 months ago Bacterial vaginosis   [place] [facility] [place],  NP   6 months ago Annual physical exam   [place] [facility] Larae Grooms, NP   7 months ago Encounter for smoking  cessation counseling   [place] [facility] Larae Grooms, NP   9 months ago Encounter for smoking cessation counseling   [place] [facility] Lar[facility] G[facility], NP   11 months ago Controlled type 2 diabetes mellitus without complication, without long-term current use of insulin (HCC)   [place] [facility] Larae Grooms, NP               DULoxetine (CYMBALTA) 20 MG capsule [Pharmacy Med Name: DULOXETINE HCL DR CAPS 20MG ] 90 capsule 0    Sig: TAKE 1 CAPSULE DAILY     Psychiatry: Antidepressants - SNRI - duloxetine Failed - 01/15/2023 12:51 AM      Failed - Cr in normal range and within 360 days    Creatinine  Date Value Ref Range Status  10/02/2013 0.76 0.60 - 1.30 mg/dL Final   Creatinine, Ser  Date Value Ref Range Status  06/28/2022 1.10 (H) 0.57 - 1.00 mg/dL Final         Passed - eGFR is 30 or above and within 360 days    EGFR (African American)  Date Value Ref Range Status  10/02/2013 >60  Final   EGFR (Non-African Amer.)  Date Value Ref Range Status  10/02/2013 >60  Final    Comment:    eGFR values <52mL/min/1.73 m2 may be an indication of chronic kidney disease (CKD). Calculated eGFR is useful in patients with stable renal function. The eGFR calculation will not be reliable in acutely ill patients when serum creatinine is changing rapidly. It is not useful in  patients on dialysis. The eGFR calculation may not be applicable to patients at the low and high extremes of body sizes, pregnant women, and vegetarians.    GFR, Estimated  Date Value Ref Range Status  01/21/2022 >60 >60 mL/min Final    Comment:    (NOTE) Calculated using the CKD-EPI Creatinine Equation (2021)    eGFR  Date Value Ref Range Status  06/28/2022 58 (L) >59 mL/min/1.73 Final         Passed - Completed PHQ-2 or PHQ-9 in the last 360 days      Passed - Last BP in normal range    BP Readings from Last 1 Encounters:  10/24/22  136/73         Passed - Valid encounter within last 6 months    Recent Outpatient Visits           2 months ago Bacterial vaginosis   [place] [facility] Larae G[facility], NP   6 months ago Annual physical exam   [place] [facility] Larae Grooms, NP   7 months ago Encounter for smoking cessation counseling   [place] [facility] Larae G[facility], NP   9 months ago Encounter for smoking cessation counseling   [place] [facility], Clydie Braun, NP   11 months ago Controlled type 2 diabetes mellitus without complication, without long-term current use of insulin (HCC)   [place] [facility] Larae Grooms, NP               atenolol (TENORMIN) 50 MG tablet [Pharmacy Med Name: ATENOLOL TABS 50MG ] 180 tablet 0    Sig: TAKE 1 TABLET TWICE A DAY     Cardiovascular: Beta Blockers 2 Failed - 01/15/2023  12:51 AM      Failed - Cr in normal range and within 360 days    Creatinine  Date Value Ref Range Status  10/02/2013 0.76 0.60 - 1.30 mg/dL Final   Creatinine, Ser  Date Value Ref Range Status  06/28/2022 1.10 (H) 0.57 - 1.00 mg/dL Final         Passed - Last BP in normal range    BP Readings from Last 1 Encounters:  10/24/22 136/73         Passed - Last Heart Rate in normal range    Pulse Readings from Last 1 Encounters:  10/24/22 88         Passed - Valid encounter within last 6 months    Recent Outpatient Visits           2 months ago Bacterial vaginosis   [place] [facility] Larae G[facility], NP   6 months ago Annual physical exam   [place] [facility] Lar[facility] G[facility], NP   7 months ago Encounter for smoking cessation counseling   [place] [facility] Larae Grooms, NP   9 months ago Encounter for smoking cessation counseling   [place] [facility] [place], Clydie Braun, NP   11 months ago Controlled  type 2 diabetes mellitus without complication, without long-term current use of insulin (HCC)   [place] [facility] [place], Clydie Braun, NP               amLODipine (NORVASC) 2.5 MG tablet [Pharmacy Med Name: AMLODIPINE BESYLATE TABS 2.5MG ] 90 tablet 0    Sig: TAKE 1 TABLET DAILY     Cardiovascular: Calcium Channel Blockers 2 Passed - 01/15/2023 12:51 AM      Passed - Last BP in normal range    BP Readings from Last 1 Encounters:  10/24/22 136/73         Passed - Last Heart Rate in normal range    Pulse Readings from Last 1 Encounters:  10/24/22 88         Passed - Valid encounter within last 6 months    Recent Outpatient Visits           2 months ago Bacterial vaginosis   [place] [facility] Larae Grooms, NP   6 months ago Annual physical exam   [place] [facility] Larae G[facility], NP   7 months ago Encounter for smoking cessation counseling   [place] [facility] Larae Grooms, NP   9 months ago Encounter for smoking cessation counseling   [place] [facility] Lar[facility] G[facility], NP   11 months ago Controlled type 2 diabetes mellitus without complication, without long-term current use of insulin [facility])   [place] [facility] Larae Grooms, NP

## 2023-01-22 ENCOUNTER — Other Ambulatory Visit: Payer: Self-pay | Admitting: Nurse Practitioner

## 2023-01-23 NOTE — Telephone Encounter (Signed)
Requested Prescriptions  Pending Prescriptions Disp Refills   metFORMIN (GLUCOPHAGE) 1000 MG tablet [Pharmacy Med Name: METFORMIN HCL TABS 1000MG ] 180 tablet 0    Sig: TAKE 1 TABLET TWICE A DAY     Endocrinology:  Diabetes - Biguanides Failed - 01/22/2023 12:45 AM      Failed - Cr in normal range and within 360 days    Creatinine  Date Value Ref Range Status  10/02/2013 0.76 0.60 - 1.30 mg/dL Final   Creatinine, Ser  Date Value Ref Range Status  06/28/2022 1.10 (H) 0.57 - 1.00 mg/dL Final         Failed - HBA1C is between 0 and 7.9 and within 180 days    Hemoglobin A1C  Date Value Ref Range Status  09/22/2011 7.5 (H) 4.2 - 6.3 % Final    Comment:    The American Diabetes Association recommends that a primary goal of therapy should be <7% and that physicians should reevaluate the treatment regimen in patients with HbA1c values consistently >8%.    Hgb A1c MFr Bld  Date Value Ref Range Status  06/28/2022 7.7 (H) 4.8 - 5.6 % Final    Comment:             Prediabetes: 5.7 - 6.4          Diabetes: >6.4          Glycemic control for adults with diabetes: <7.0          Failed - eGFR in normal range and within 360 days    EGFR (African American)  Date Value Ref Range Status  10/02/2013 >60  Final   EGFR (Non-African Amer.)  Date Value Ref Range Status  10/02/2013 >60  Final    Comment:    eGFR values <76mL/min/1.73 m2 may be an indication of chronic kidney disease (CKD). Calculated eGFR is useful in patients with stable renal function. The eGFR calculation will not be reliable in acutely ill patients when serum creatinine is changing rapidly. It is not useful in  patients on dialysis. The eGFR calculation may not be applicable to patients at the low and high extremes of body sizes, pregnant women, and vegetarians.    GFR, Estimated  Date Value Ref Range Status  01/21/2022 >60 >60 mL/min Final    Comment:    (NOTE) Calculated using the CKD-EPI Creatinine Equation  (2021)    eGFR  Date Value Ref Range Status  06/28/2022 58 (L) >59 mL/min/1.73 Final         Failed - B12 Level in normal range and within 720 days    No results found for: "VITAMINB12"       Passed - Valid encounter within last 6 months    Recent Outpatient Visits           3 months ago Bacterial vaginosis   [place] [facility], NP   6 months ago Annual physical exam   [place] [facility] Larae Grooms, NP   7 months ago Encounter for smoking cessation counseling   [place] [facility] Larae Grooms, NP   10 months ago Encounter for smoking cessation counseling   [place] [facility] Larae Grooms, NP   11 months ago Controlled type 2 diabetes mellitus without complication, without long-term current use of insulin [facility])   [place] [facility] Lar[facility] G[facility], NP              Passed - CBC  within normal limits and completed in the last 12 months    WBC  Date Value Ref Range Status  06/28/2022 9.8 3.4 - 10.8 x10E3/uL Final  01/21/2022 8.4 4.0 - 10.5 K/uL Final   RBC  Date Value Ref Range Status  06/28/2022 4.15 3.77 - 5.28 x10E6/uL Final  01/21/2022 4.40 3.87 - 5.11 MIL/uL Final   Hemoglobin  Date Value Ref Range Status  06/28/2022 12.5 11.1 - 15.9 g/dL Final   Hematocrit  Date Value Ref Range Status  06/28/2022 37.9 34.0 - 46.6 % Final   MCHC  Date Value Ref Range Status  06/28/2022 33.0 31.5 - 35.7 g/dL Final  78/29/5621 30.8 30.0 - 36.0 g/dL Final   [facility]  Date Value Ref Range Status  06/28/2022 30.1 26.6 - 33.0 pg Final  01/21/2022 30.2 26.0 - 34.0 pg Final   MCV  Date Value Ref Range Status  06/28/2022 91 79 - 97 fL Final  10/02/2013 89 80 - 100 fL Final   No results found for: "PLTCOUNTKUC", "LABPLAT", "POCPLA" RDW  Date Value Ref Range Status  06/28/2022 12.0 11.7 - 15.4 % Final  10/02/2013 13.1 11.5 - 14.5 % Final

## 2023-03-13 ENCOUNTER — Encounter: Payer: Self-pay | Admitting: Emergency Medicine

## 2023-06-16 ENCOUNTER — Ambulatory Visit
Admission: EM | Admit: 2023-06-16 | Discharge: 2023-06-16 | Disposition: A | Discharge status: Home / Self Care | Payer: Medicaid Other

## 2023-06-16 ENCOUNTER — Encounter: Payer: Self-pay | Admitting: Emergency Medicine

## 2023-06-16 DIAGNOSIS — B351 Tinea unguium: Secondary | ICD-10-CM

## 2023-06-16 DIAGNOSIS — L03032 Cellulitis of left toe: Secondary | ICD-10-CM | POA: Diagnosis not present

## 2023-06-16 MED ORDER — MUPIROCIN 2 % EX OINT: 1.0000 | TOPICAL_OINTMENT | Freq: Two times a day (BID) | CUTANEOUS | 0 refills | Status: DC

## 2023-06-16 MED ORDER — DOXYCYCLINE HYCLATE 100 MG PO CAPS: 100.0000 mg | ORAL_CAPSULE | Freq: Two times a day (BID) | ORAL | 0 refills | Status: AC

## 2023-06-16 NOTE — ED Triage Notes (Signed)
Patient reports pain in the nail of her left big toe.  Patient reports drainage from the site on Friday.  Patient denies fevers.

## 2023-06-16 NOTE — Discharge Instructions (Signed)
-  You have a fungal toenail infection and also bacterial skin infection. - I sent an antibiotic to the pharmacy. - Clean the area with soap and water every day and apply the ointment.  May consider warm Epsom salt soaks. - I put a referral into a podiatrist for you for evaluation to see if the toenail needs to be removed. - If you notice increased redness, increased swelling, fever, worsening pain please seek immediate reevaluation.

## 2023-06-16 NOTE — ED Provider Notes (Signed)
[facility]    CSN: [id number] Arrival date & time: 06/16/23  0915      History   Chief Complaint Chief Complaint  Patient presents with   Toe Pain    Left big toe    HPI Kara Giles is a 62 y.o. female present for pain, swelling and redness of the left great toe distally for the past few days.  She reports scraping under her toenail and a lot of pus coming out yesterday.  She is a diabetic.  She does not have podiatrist and says that she has her feet checked by her PCP.  Denies associated fever, fatigue.  No injury to toenail.  HPI  Past Medical History:  Diagnosis Date   Diabetes mellitus without complication (HCC)    Hypertension    Thyroid disease     Patient Active Problem List   Diagnosis Date Noted   Encounter for smoking cessation counseling 02/15/2022   Anemia 10/22/2019   Obstructive sleep apnea 10/21/2019   RLS (restless legs syndrome) 10/21/2019   Anxiety 10/03/2013   Controlled type 2 diabetes mellitus without complication, without long-term current use of insulin (HCC) 09/25/2011   Hyperlipidemia 09/25/2011   Hypertension 09/25/2011   Hypothyroidism, unspecified 09/25/2011    Past Surgical History:  Procedure Laterality Date   TOTAL ABDOMINAL HYSTERECTOMY      OB History   No obstetric history on file.      Home Medications    Prior to Admission medications   Medication Sig Start Date End Date Taking? Authorizing Provider  amLODipine (NORVASC) 2.5 MG tablet TAKE 1 TABLET DAILY 01/16/23  Yes Larae Grooms, NP  atenolol (TENORMIN) 50 MG tablet TAKE 1 TABLET TWICE A DAY 01/16/23  Yes Larae Grooms, NP  doxepin (SINEQUAN) 10 MG capsule TAKE 1 CAPSULE BY MOUTH EVERY EVENING. 09/12/22  Yes Larae Grooms, NP  doxycycline (VIBRAMYCIN) 100 MG capsule Take 1 capsule (100 mg total) by mouth 2 (two) times daily for 7 days. 06/16/23 06/23/23 Yes Eusebio Friendly B, PA-C  DULoxetine (CYMBALTA) 20 MG capsule TAKE 1 CAPSULE DAILY  01/16/23  Yes Larae Grooms, NP  hydrochlorothiazide (HYDRODIURIL) 12.5 MG tablet TAKE 1 TABLET DAILY 01/16/23  Yes Larae Grooms, NP  latanoprost (XALATAN) 0.005 % ophthalmic solution 1 drop at bedtime. 04/18/21  Yes [provider]  levothyroxine (SYNTHROID) 75 MCG tablet TAKE 1 TABLET DAILY 01/16/23  Yes Larae Grooms, NP  losartan (COZAAR) 100 MG tablet TAKE 1 TABLET DAILY 01/16/23  Yes Larae Grooms, NP  metFORMIN (GLUCOPHAGE) 1000 MG tablet TAKE 1 TABLET TWICE A DAY 01/23/23  Yes Larae Grooms, NP  MOUNJARO 12.5 MG/0.5ML Pen Inject 12.5 mg into the skin once a week. 05/31/23  Yes [provider]  mupirocin ointment (BACTROBAN) 2 % Apply 1 Application topically 2 (two) times daily. 06/16/23  Yes Eusebio Friendly B, PA-C  rosuvastatin (CRESTOR) 40 MG tablet TAKE 1 TABLET DAILY 01/16/23  Yes Larae Grooms, NP  Dulaglutide (TRULICITY) 0.75 MG/0.5ML SOPN INJECT 0.75 MG AS DIRECTED ONCE A WEEK 01/09/23   Larae Grooms, NP  omeprazole (PRILOSEC) 20 MG capsule Take 1 capsule (20 mg total) by mouth daily. 08/15/22   Larae Grooms, NP  ondansetron (ZOFRAN-ODT) 4 MG disintegrating tablet Take 1 tablet (4 mg total) by mouth every 8 (eight) hours as needed for nausea or vomiting. 01/21/22   Willy Eddy, MD    Family History History reviewed. No pertinent family history.  Social History Social History   Tobacco Use  Smoking status: Former    Current packs/day: 1.00    Types: Cigarettes   Smokeless tobacco: Never  Vaping Use   Vaping status: Never Used  Substance Use Topics   Alcohol use: Not Currently   Drug use: Never     Allergies   Lisinopril   Review of Systems Review of Systems  Constitutional:  Negative for fatigue and fever.  Musculoskeletal:  Positive for joint swelling. Negative for arthralgias.  Skin:  Positive for color change. Negative for wound.  Neurological:  Negative for weakness and numbness.     Physical Exam Triage  Vital Signs ED Triage Vitals [06/16/23 0926]  Encounter Vitals Group     BP      Systolic BP Percentile      Diastolic BP Percentile      Pulse      Resp      Temp      Temp src      SpO2      Weight 191 lb (86.6 kg)     Height 5\' 7"  (1.702 m)     Head Circumference      Peak Flow      Pain Score 8     Pain Loc      Pain Education      Exclude from Growth Chart    No data found.  Updated Vital Signs BP 130/79 (BP Location: Left Arm)   Pulse 88   Temp 98.5 F (36.9 C) (Oral)   Resp 14   Ht 5\' 7"  (1.702 m)   Wt 191 lb (86.6 kg)   SpO2 100%   BMI 29.91 kg/m     Physical Exam Vitals and nursing note reviewed.  Constitutional:      General: She is not in acute distress.    Appearance: Normal appearance. She is not ill-appearing or toxic-appearing.  HENT:     Head: Normocephalic and atraumatic.  Eyes:     General: No scleral icterus.       Right eye: No discharge.        Left eye: No discharge.     Conjunctiva/sclera: Conjunctivae normal.  Cardiovascular:     Rate and Rhythm: Normal rate and regular rhythm.     Pulses: Normal pulses.  Pulmonary:     Effort: Pulmonary effort is normal. No respiratory distress.  Musculoskeletal:     Cervical back: Neck supple.  Skin:    General: Skin is dry.     Findings: Erythema present.     Comments: Left great toe: Significantly onychomycotic great toenail.  There is erythema and swelling of the distal toe.  Small amount of pustular discharge noted around the nailbed area is diffusely tender.  Neurological:     General: No focal deficit present.     Mental Status: She is alert. Mental status is at baseline.     Motor: No weakness.     Gait: Gait normal.  Psychiatric:        Mood and Affect: Mood normal.      UC Treatments / Results  Labs (all labs ordered are listed, but only abnormal results are displayed) Labs Reviewed - No data to display  EKG   Radiology No results found.  Procedures Procedures  (including critical care time)  Medications Ordered in UC Medications - No data to display  Initial Impression / Assessment and Plan / UC Course  I have reviewed the triage vital signs and the nursing notes.  Pertinent labs &  imaging results that were available during my care of the patient were reviewed by me and considered in my medical decision making (see chart for details).   62 year old female with history of diabetes presents for pain, swelling, erythema and pustular drainage of the left great toe for the past couple days.  On exam she has significant fungal toenails with great toenail, erythema, increased warmth and swelling consistent with cellulitis.  Small amount of pustular drainage.  Reviewed supportive care.  Placing patient on doxycycline and mupirocin ointment.  Placed referral to podiatry.  Encouraged very close monitoring and advised to return or go to ED for any acute worsening of symptoms.   Final Clinical Impressions(s) / UC Diagnoses   Final diagnoses:  Cellulitis of left toe  Onychomycosis of left great toe     Discharge Instructions      -You have a fungal toenail infection and also bacterial skin infection. - I sent an antibiotic to the pharmacy. - Clean the area with soap and water every day and apply the ointment.  May consider warm Epsom salt soaks. - I put a referral into a podiatrist for you for evaluation to see if the toenail needs to be removed. - If you notice increased redness, increased swelling, fever, worsening pain please seek immediate reevaluation.     ED Prescriptions     Medication Sig Dispense Auth. Provider   doxycycline (VIBRAMYCIN) 100 MG capsule Take 1 capsule (100 mg total) by mouth 2 (two) times daily for 7 days. 14 capsule Eusebio Friendly B, PA-C   mupirocin ointment (BACTROBAN) 2 % Apply 1 Application topically 2 (two) times daily. 22 g Shirlee Latch, PA-C      PDMP not reviewed this encounter.   Shirlee Latch,  PA-C 06/16/23 [phone]

## 2023-07-04 ENCOUNTER — Ambulatory Visit (INDEPENDENT_AMBULATORY_CARE_PROVIDER_SITE_OTHER): Payer: Medicaid Other | Admitting: Podiatry

## 2023-07-04 ENCOUNTER — Encounter: Payer: Self-pay | Admitting: Podiatry

## 2023-07-04 DIAGNOSIS — L600 Ingrowing nail: Secondary | ICD-10-CM | POA: Diagnosis not present

## 2023-07-04 DIAGNOSIS — B351 Tinea unguium: Secondary | ICD-10-CM | POA: Diagnosis not present

## 2023-07-04 MED ORDER — MUPIROCIN 2 % EX OINT: 1.0000 | TOPICAL_OINTMENT | Freq: Two times a day (BID) | CUTANEOUS | 0 refills | Status: AC

## 2023-07-04 MED ORDER — TERBINAFINE HCL 250 MG PO TABS: 250.0000 mg | ORAL_TABLET | Freq: Every day | ORAL | 0 refills | Status: AC

## 2023-07-04 NOTE — Patient Instructions (Addendum)
Soak Instructions    THE DAY AFTER THE PROCEDURE  Place 1/4 cup of epsom salts (or betadine, or white vinegar) in a quart of warm tap water.  Submerge your foot or feet with outer bandage intact for the initial soak; this will allow the bandage to become moist and wet for easy lift off.  Once you remove your bandage, continue to soak in the solution for 20 minutes.  This soak should be done twice a day.  Next, remove your foot or feet from solution, blot dry the affected area and cover.  You may use a band aid large enough to cover the area or use gauze and tape.  Apply other medications to the area as directed by the doctor such as polysporin neosporin.  IF YOUR SKIN BECOMES IRRITATED WHILE USING THESE INSTRUCTIONS, IT IS OKAY TO SWITCH TO  WHITE VINEGAR AND WATER. Or you may use antibacterial soap and water to keep the toe clean  Monitor for any signs/symptoms of infection. Call the office immediately if any occur or go directly to the emergency room. Call with any questions/concerns.     VISIT SUMMARY:  Today, you were seen for a severely infected toenail on your great toes, which have a fungal infection and a condition called pincer nail deformity. We discussed the nature of your condition and the treatment options available.  YOUR PLAN:  -PINCER NAIL DEFORMITY WITH ONYCHOMYCOSIS: Pincer nail deformity is a condition where the toenail curves inward, resembling a 'cinnamon roll,' and onychomycosis is a fungal infection of the nail. We will perform nail avulsion on both great toes today to remove the affected nails. You will start taking Lamisil (terbinafine) for the fungal infection, apply Mupirocin ointment, and do Epsom salt soaks twice a day for two weeks. We will follow up in four months to check the response to the treatment and new nail growth.  -DIABETES: Your diabetes is well controlled, and there are no issues with you taking Lamisil. We will continue with your current diabetes  management plan.  INSTRUCTIONS:  Please follow up in four months to evaluate the response to Lamisil and new nail growth. Continue with the prescribed treatment and management plan.

## 2023-07-04 NOTE — Progress Notes (Signed)
  Subjective:  Patient ID: Kara Giles, female    DOB: Nov 07, 1960,  MRN: [id number]  Chief Complaint  Patient presents with   Diabetes    "I have infection under my big toenail.  The toenail is so thick.  I filed it down a little bit, it hurts to touch."    Discussed the use of AI scribe software for clinical note transcription with the patient, who gave verbal consent to proceed.  History of Present Illness   The patient, with a history of diabetes, presents with a severely infected toenail. She reports that the nail has a 'really bad nail fungus' and has curved in significantly, resembling a 'cinnamon roll.' The patient's other big toe also has a similar, but less severe, issue. The patient's mother, also a diabetic, had similar issues with her toenails. The patient's toenails are tender to touch. She has not previously taken any oral antifungal medication for this issue.          Objective:    Physical Exam   CARDIOVASCULAR: Palpable dorsal pedal (DP) and pedal pulse (PP) bilaterally. 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 SKIN: Thick and elongated dystrophic mycotic nails times ten.       No images are attached to the encounter.    Results   Procedure: Avulsion of bilateral great toenails Description: Following a digital block of both great toenails with 0.5 cc of 0.5% Marcaine and 1.5 cc of 2% lidocaine plain, both great toes were prepared with betadine, exsanguinated, and a tourniquet placed around the base of the toe. A freer elevator was used to avulse the nail plate from the underlying nail bed. The area was dressed with triple antibiotic ointment and a bandage. Informed Consent: Counseling included the risks of soreness and potential for the toenail not to regrow, benefits of removing the deformed nail and potential for normal regrowth, and alternatives such as burning the root to prevent regrowth. The patient was informed about post-procedure  care, including the use of antibiotic cream and Epsom salt soaks.  LABS LFTs: normal (05/31/2023) HgbA1c: 7.3% (05/31/2023)      Assessment:   1. Ingrowing left great toenail   2. Ingrowing right great toenail   3. Onychomycosis      Plan:  Patient was evaluated and treated and all questions answered.  Assessment and Plan    Pincer Nail Deformity with Onychomycosis   She presents with severe pincer nail deformity and fungal infection on both great toes, with no known history of nail trauma. We discussed the structural issue with the nail and the limitations of oral antifungal medication. Today, we will perform nail avulsion on both great toes. We will start Lamisil (terbinafine) for antifungal treatment, apply Mupirocin ointment, and perform Epsom salt soaks twice a day for two weeks. A follow-up in four months will evaluate the response to Lamisil and new nail growth.  Diabetes   Her diabetes is well controlled with no contraindications to taking Lamisil. We will continue current management.          Return in about 4 months (around 11/01/2023) for follow up after nail fungus treatment.

## 2023-11-05 ENCOUNTER — Ambulatory Visit: Payer: Medicaid Other | Admitting: Podiatry

## 2023-11-05 ENCOUNTER — Encounter: Payer: Self-pay | Admitting: Podiatry

## 2023-11-05 DIAGNOSIS — B351 Tinea unguium: Secondary | ICD-10-CM

## 2023-11-05 NOTE — Progress Notes (Signed)
  Subjective:  Patient ID: Kara Giles, female    DOB: November 05, 1960,  MRN: [id number]  Chief Complaint  Patient presents with   Nail Problem    "They're doing okay.  I had some nail polish on them.  I tried to get it all off."    63 y.o. female presents with the above complaint. History confirmed with patient.  She notes they are doing better she is not having any pain  Objective:  Physical Exam: warm, good capillary refill, no trophic changes or ulcerative lesions, normal DP and PT pulses, normal sensory exam, and good regrowth of nails with minimal dystrophy.  Assessment:   1. Onychomycosis      Plan:  Patient was evaluated and treated and all questions answered.  Overall doing well she has good regrowth of nail with minimal dystrophy, discussed with her that I expect will take another 6 to 9 months for complete normal appearance of the toenail.  Does not need further antifungal treatment at this point.  Continue to monitor on a regular basis.  Follow-up with me as needed if she has further issues.  No follow-ups on file.

## 2024-01-24 IMAGING — CR DG ABDOMEN ACUTE W/ 1V CHEST
4 series · 4 of 4 positions shown · non-contrast
Comparison: Chest and abdominal radiographs 05/20/2021 and earlier.

CLINICAL DATA: 61-year-old female with nausea vomiting and
diarrhea.

EXAM:
DG ABDOMEN ACUTE WITH 1 VIEW CHEST

[chest pa]
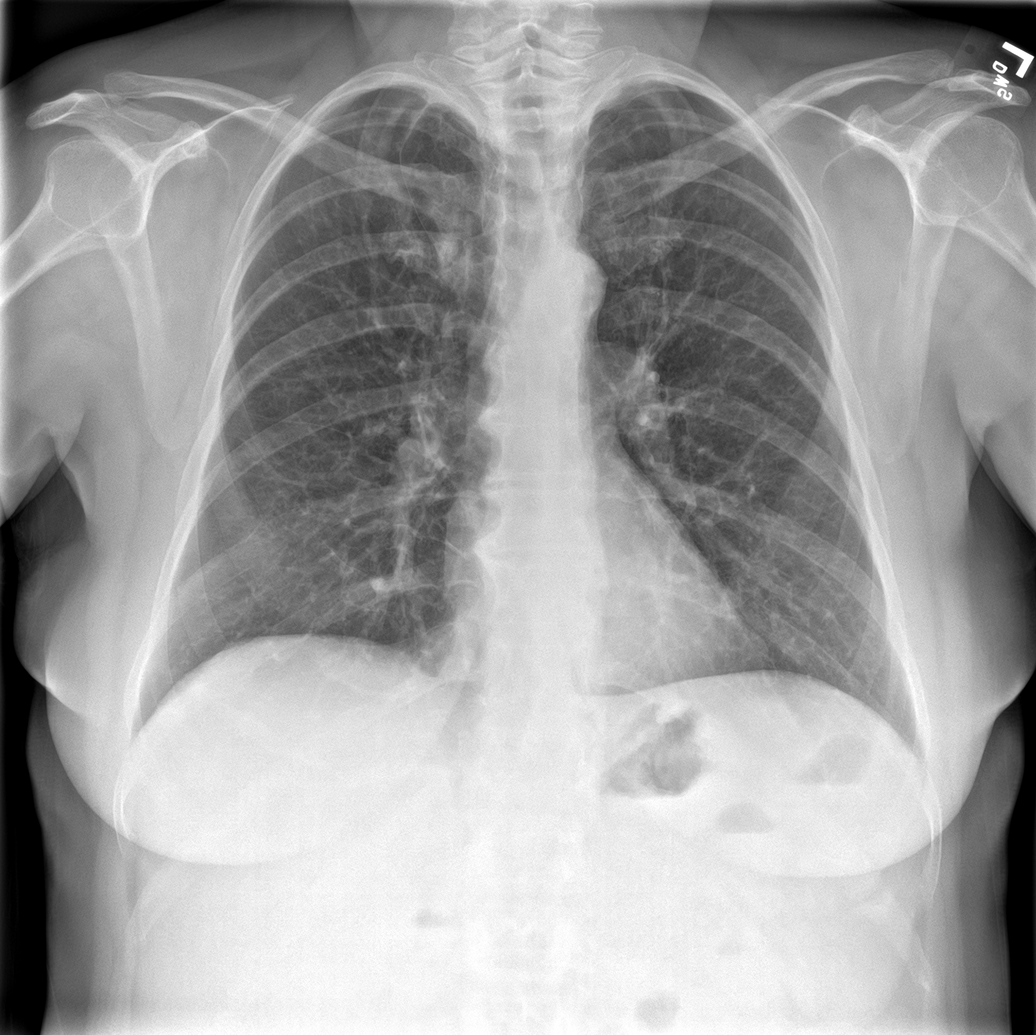

[abdomen erect]
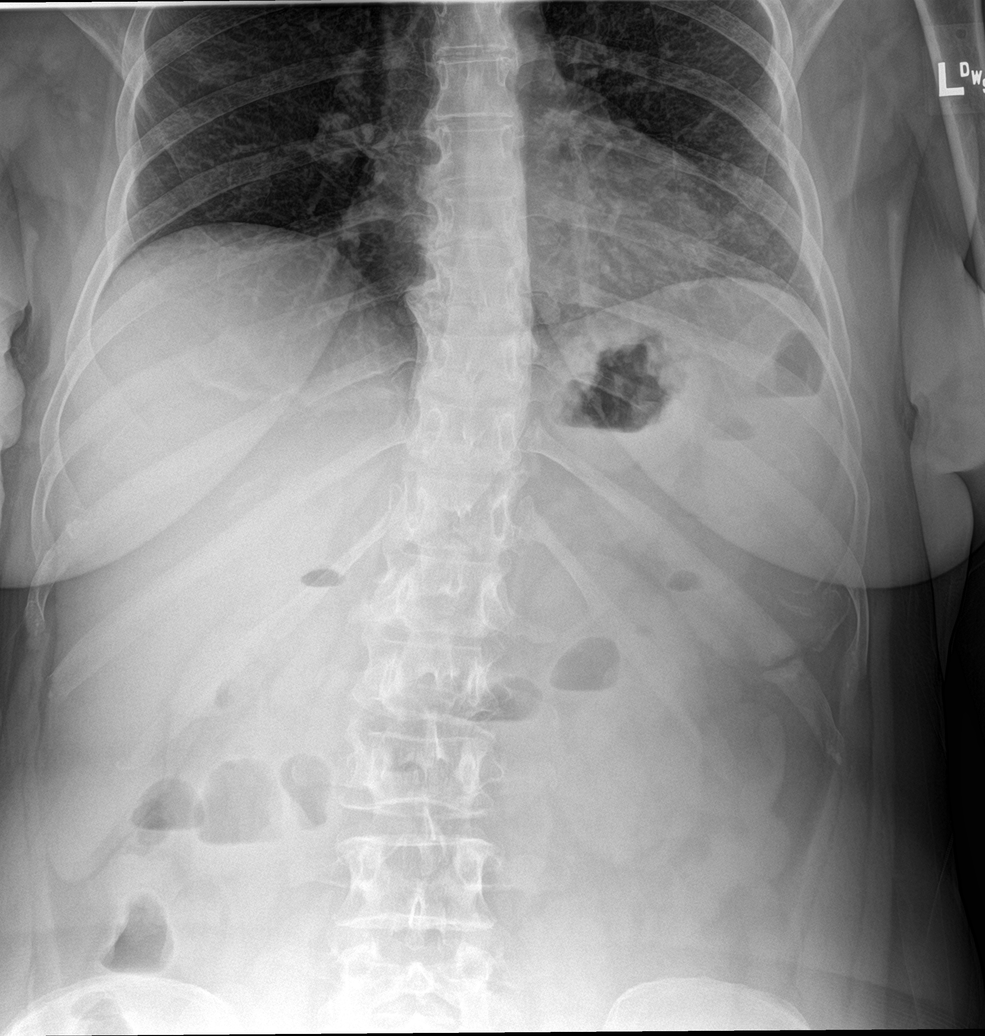

[abdomen supine (1 of 2)]
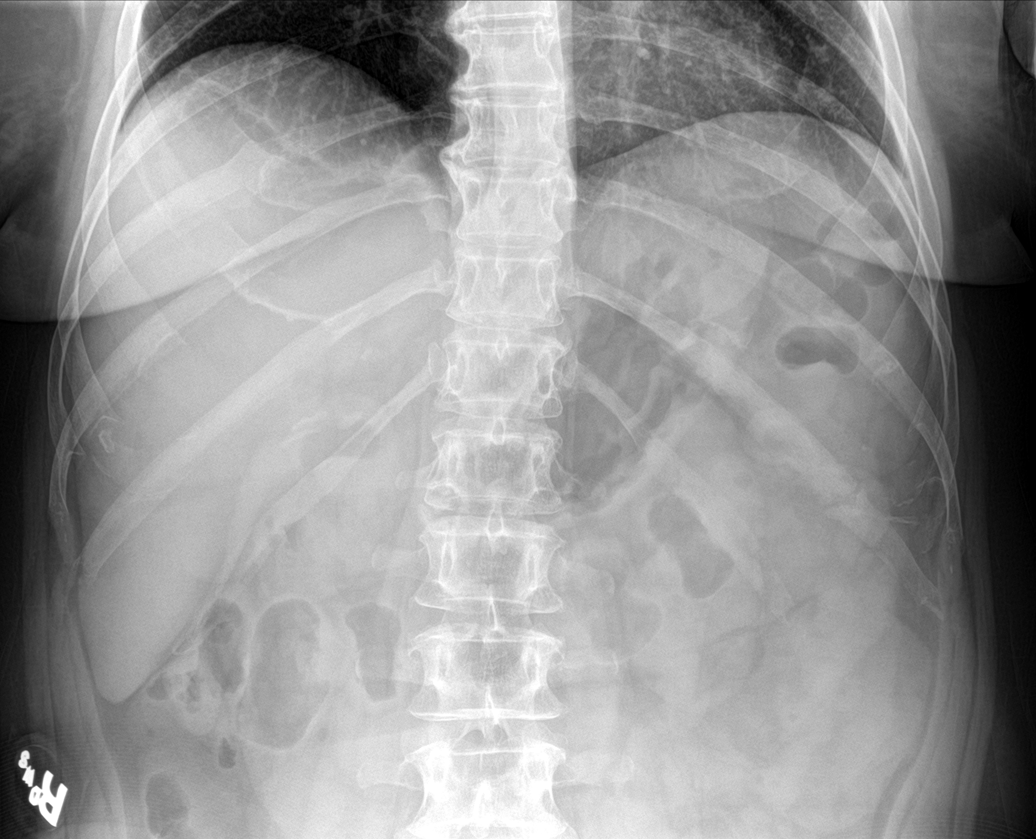

[abdomen supine (2 of 2)]
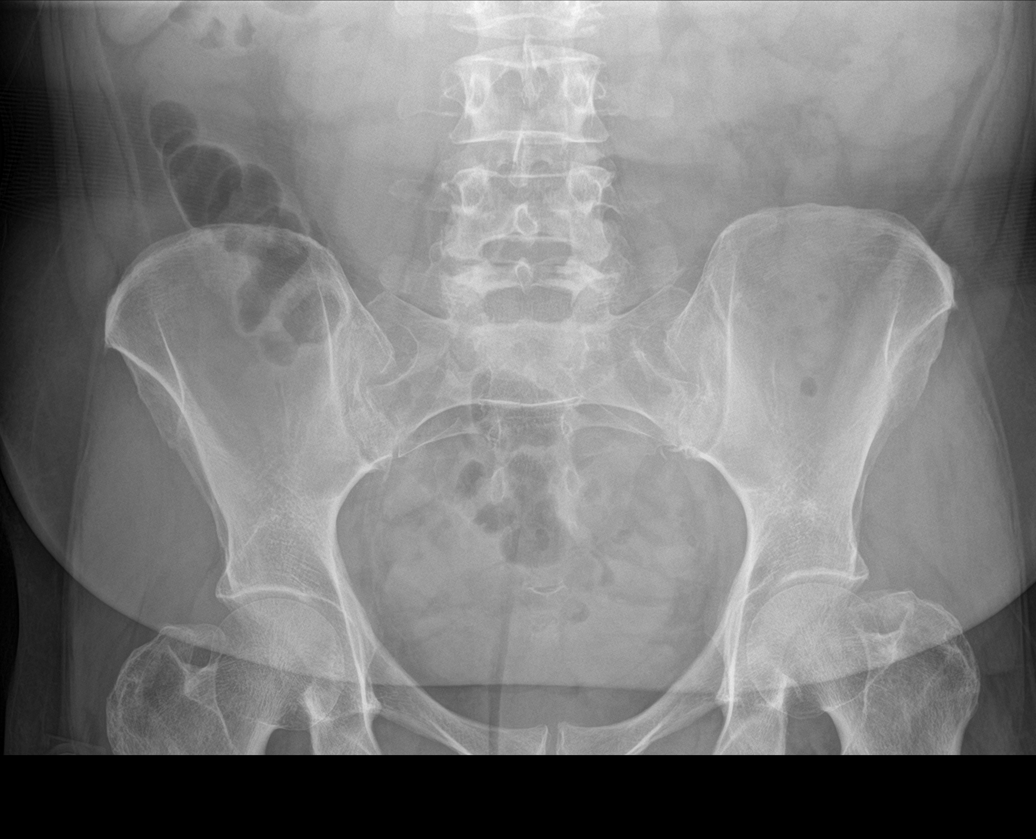

[4 of 4 positions shown; findings below may reference images not displayed]

FINDINGS: PA chest at 5635 hours. Normal lung volumes and mediastinal
contours. Visualized tracheal air column is within normal limits.
Both lungs appear clear. No pneumothorax or pneumoperitoneum.

Non obstructed bowel gas pattern. There are some air-fluid levels in
the transverse colon to the splenic flexure, but no dilated loops
are evident. No small bowel air-fluid. Abdominal and pelvic visceral
contours appear normal.

No acute osseous abnormality identified.
IMPRESSION: 1. Nonobstructed bowel gas pattern, no free air. Transverse colon
air-fluid levels compatible with diarrhea.
2. No acute cardiopulmonary abnormality.
